# Patient Record
Sex: Male | Born: 1935
Health system: Southern US, Community
[De-identification: ages and names within clinical notes are randomized; demographics above are authoritative.]

## PROBLEM LIST (undated history)

## (undated) DIAGNOSIS — K635 Polyp of colon: Secondary | ICD-10-CM

## (undated) DIAGNOSIS — I10 Essential (primary) hypertension: Secondary | ICD-10-CM

## (undated) DIAGNOSIS — R011 Cardiac murmur, unspecified: Secondary | ICD-10-CM

## (undated) HISTORY — DX: Cardiac murmur, unspecified: R01.1

## (undated) HISTORY — DX: Polyp of colon: K63.5

## (undated) HISTORY — DX: Essential (primary) hypertension: I10

## (undated) HISTORY — PX: SPINE SURGERY: SHX786

## (undated) HISTORY — PX: BACK SURGERY: SHX140

---

## 1999-10-18 ENCOUNTER — Inpatient Hospital Stay (HOSPITAL_COMMUNITY): Admission: EM | Admit: 1999-10-18 | Discharge: 1999-10-19 | Payer: Self-pay | Admitting: Emergency Medicine

## 1999-10-18 ENCOUNTER — Encounter: Payer: Self-pay | Admitting: Emergency Medicine

## 1999-10-19 ENCOUNTER — Encounter: Payer: Self-pay | Admitting: *Deleted

## 2000-11-17 ENCOUNTER — Ambulatory Visit (HOSPITAL_COMMUNITY): Admission: RE | Admit: 2000-11-17 | Discharge: 2000-11-17 | Payer: Self-pay | Admitting: *Deleted

## 2010-08-23 LAB — HM COLONOSCOPY

## 2011-11-21 ENCOUNTER — Ambulatory Visit: Payer: Medicare Other

## 2011-11-21 ENCOUNTER — Ambulatory Visit (INDEPENDENT_AMBULATORY_CARE_PROVIDER_SITE_OTHER): Payer: Medicare Other | Admitting: Emergency Medicine

## 2011-11-21 VITALS — BP 183/78 | HR 66 | Resp 18 | Ht 75.0 in | Wt 231.0 lb

## 2011-11-21 DIAGNOSIS — M79671 Pain in right foot: Secondary | ICD-10-CM

## 2011-11-21 DIAGNOSIS — I10 Essential (primary) hypertension: Secondary | ICD-10-CM

## 2011-11-21 DIAGNOSIS — R739 Hyperglycemia, unspecified: Secondary | ICD-10-CM

## 2011-11-21 DIAGNOSIS — E1049 Type 1 diabetes mellitus with other diabetic neurological complication: Secondary | ICD-10-CM

## 2011-11-21 DIAGNOSIS — M25569 Pain in unspecified knee: Secondary | ICD-10-CM

## 2011-11-21 DIAGNOSIS — G629 Polyneuropathy, unspecified: Secondary | ICD-10-CM

## 2011-11-21 DIAGNOSIS — M79609 Pain in unspecified limb: Secondary | ICD-10-CM

## 2011-11-21 LAB — POCT CBC
Granulocyte percent: 62.5 %G (ref 37–80)
HCT, POC: 43.6 % (ref 43.5–53.7)
Hemoglobin: 14.2 g/dL (ref 14.1–18.1)
Lymph, poc: 1.7 (ref 0.6–3.4)
MCH, POC: 33 pg — AB (ref 27–31.2)
MCHC: 32.6 g/dL (ref 31.8–35.4)
MCV: 101.5 fL — AB (ref 80–97)
MID (cbc): 0.4 (ref 0–0.9)
MPV: 9.9 fL (ref 0–99.8)
POC Granulocyte: 3.5 (ref 2–6.9)
POC LYMPH PERCENT: 29.8 %L (ref 10–50)
POC MID %: 7.7 %M (ref 0–12)
Platelet Count, POC: 161 10*3/uL (ref 142–424)
RBC: 4.3 M/uL — AB (ref 4.69–6.13)
RDW, POC: 13.6 %
WBC: 5.6 10*3/uL (ref 4.6–10.2)

## 2011-11-21 LAB — RPR

## 2011-11-21 LAB — COMPREHENSIVE METABOLIC PANEL
Albumin: 4.3 g/dL (ref 3.5–5.2)
Alkaline Phosphatase: 78 U/L (ref 39–117)
BUN: 17 mg/dL (ref 6–23)
Calcium: 9.7 mg/dL (ref 8.4–10.5)
Creat: 1.12 mg/dL (ref 0.50–1.35)
Glucose, Bld: 119 mg/dL — ABNORMAL HIGH (ref 70–99)
Potassium: 4.8 mEq/L (ref 3.5–5.3)

## 2011-11-21 LAB — VITAMIN B12: Vitamin B-12: 260 pg/mL (ref 211–911)

## 2011-11-21 LAB — POCT GLYCOSYLATED HEMOGLOBIN (HGB A1C): Hemoglobin A1C: 5.2

## 2011-11-21 LAB — GLUCOSE, POCT (MANUAL RESULT ENTRY): POC Glucose: 120

## 2011-11-21 MED ORDER — GABAPENTIN 300 MG PO CAPS: ORAL_CAPSULE | ORAL | Status: DC

## 2011-11-21 NOTE — Patient Instructions (Signed)
Neuropathy Neuropathy means your peripheral nerves are not working normally. Peripheral nerves are the nerves outside the brain and spinal cord. Messages between the brain and the rest of the body do not work properly with peripheral nerve disorders. CAUSES There are many different causes of peripheral nerve disorders. These include:  Injury.   Infections.   Diabetes.   Vitamin deficiency.   Poor circulation.   Alcoholism.   Exposure to toxins.   Drug effects.   Tumors.   Kidney disease.  SYMPTOMS  Tingling, burning, pain, and numbness in the extremities.   Weakness and loss of muscle tone and size.  DIAGNOSIS Blood tests and special studies of nerve function may help confirm the diagnosis.  TREATMENT  Treatment includes adopting healthy life habits.   A good diet, vitamin supplements, and mild pain medicine may be needed.   Avoid known toxins such as alcohol, tobacco, and recreational drugs.   Anti-convulsant medicines are helpful in some types of neuropathy.  Make a follow-up appointment with your caregiver to be sure you are getting better with treatment.  SEEK IMMEDIATE MEDICAL CARE IF:   You have breathing problems.   You have severe or uncontrolled pain.   You notice extreme weakness or you feel faint.   You are not better after 1 week or if you have worse symptoms.  Document Released: 09/16/2004 Document Revised: 04/21/2011 Document Reviewed: 08/09/2005 [employer] Patient Information 2012 [employer]. 

## 2011-11-21 NOTE — Progress Notes (Signed)
  Subjective:    Patient ID: Collin Rose, male    DOB: [birth year], 77 y.o.   MRN: [id number]  HPI patient presents with a three-week history of a burning sensation in both feet. He says he has not had this before. He is a history of polio involving the left leg. He is status post surgery on the right knee years ago and has bony deformity of the knee related to this. He does not describe claudication symptoms he says the pain is intermittent during the day. It is more burning numb sensation than a true pain.    Review of Systems is no been diagnosed with a neuropathy. In reviewing the chart he has had an elevated MCV in the past with a normal hemoglobin raising the question of B12 deficiency.     Objective:   Physical Exam examination of the right knee reveals a bowing deformity involving the left tibial plateau. There is decreased range of motion of that knee. There are palpable dorsalis pedis pulses in both feet. There are trace palpable posterior tibial pulses. There are no skin changes noted. There is a second filling in the great toes bilaterally. He has decreased vibratory sensation decreased fine touch in both feet. This extends up to mid tibia bilaterally. There are no reflexes present ankles are  [facility] reading (PRIMARY) by  Dr.Raphael Fitzpatrick x-ray shows severe loss of joint space with calcification with a bar across the tibia        Assessment & Plan:  He has a deformity of the right knee where he has had surgery before he would like checked. His symptoms and physical exam are consistent with a neuropathy. Possibilities are B12 folic acid visit deficiency versus diabetes versus one of the other causes of neuropathy

## 2011-11-29 NOTE — Progress Notes (Signed)
Addended by: Morrell Riddle on: 11/29/2011 10:18 AM   Modules accepted: Orders

## 2011-12-01 ENCOUNTER — Other Ambulatory Visit: Payer: Self-pay | Admitting: Physician Assistant

## 2011-12-01 ENCOUNTER — Ambulatory Visit
Admission: RE | Admit: 2011-12-01 | Discharge: 2011-12-01 | Disposition: A | Discharge status: Home / Self Care | Payer: Medicare Other | Source: Ambulatory Visit | Attending: Physician Assistant | Admitting: Physician Assistant

## 2011-12-01 DIAGNOSIS — M79671 Pain in right foot: Secondary | ICD-10-CM

## 2011-12-01 DIAGNOSIS — G629 Polyneuropathy, unspecified: Secondary | ICD-10-CM

## 2011-12-01 DIAGNOSIS — M79672 Pain in left foot: Secondary | ICD-10-CM

## 2011-12-13 ENCOUNTER — Ambulatory Visit (INDEPENDENT_AMBULATORY_CARE_PROVIDER_SITE_OTHER): Payer: Medicare Other | Admitting: Emergency Medicine

## 2011-12-13 VITALS — BP 160/74 | HR 60 | Temp 98.0°F | Resp 16 | Ht 75.0 in | Wt 257.2 lb

## 2011-12-13 DIAGNOSIS — G589 Mononeuropathy, unspecified: Secondary | ICD-10-CM

## 2011-12-13 DIAGNOSIS — H9190 Unspecified hearing loss, unspecified ear: Secondary | ICD-10-CM

## 2011-12-13 DIAGNOSIS — H6120 Impacted cerumen, unspecified ear: Secondary | ICD-10-CM

## 2011-12-13 DIAGNOSIS — K219 Gastro-esophageal reflux disease without esophagitis: Secondary | ICD-10-CM

## 2011-12-13 DIAGNOSIS — I10 Essential (primary) hypertension: Secondary | ICD-10-CM

## 2011-12-13 DIAGNOSIS — G629 Polyneuropathy, unspecified: Secondary | ICD-10-CM

## 2011-12-13 MED ORDER — GABAPENTIN 300 MG PO CAPS: ORAL_CAPSULE | ORAL | Status: DC

## 2011-12-13 NOTE — Progress Notes (Signed)
  Subjective:    Patient ID: Collin Rose, male    DOB: May 09, 1935, 77 y.o.   MRN: [id number]  HPI patient here with difficulty hearing out of both ears. He feels he has wax impaction. He has had no pain he does not have a sore throat cold or congestion symptoms to    Review of Systems he was seen 3 weeks ago and started on Neurontin 300 mg twice a day. His neuropathy symptoms are markedly improved since starting the Neurontin.     Objective:   Physical Exam both TMs are occluded with wax.  Arterial Doppler studies which were done last visit showed calcification along the vessels but normal flow to      Assessment & Plan:  I wrote for refills on his Neurontin. I also will irrigate both ears.

## 2012-02-02 ENCOUNTER — Ambulatory Visit (INDEPENDENT_AMBULATORY_CARE_PROVIDER_SITE_OTHER): Payer: Medicare Other | Admitting: Emergency Medicine

## 2012-02-02 VITALS — BP 170/72 | HR 64 | Temp 97.8°F | Resp 16 | Ht 75.38 in | Wt 251.2 lb

## 2012-02-02 DIAGNOSIS — G589 Mononeuropathy, unspecified: Secondary | ICD-10-CM

## 2012-02-02 DIAGNOSIS — E785 Hyperlipidemia, unspecified: Secondary | ICD-10-CM

## 2012-02-02 DIAGNOSIS — I10 Essential (primary) hypertension: Secondary | ICD-10-CM

## 2012-02-02 DIAGNOSIS — R7309 Other abnormal glucose: Secondary | ICD-10-CM

## 2012-02-02 DIAGNOSIS — R739 Hyperglycemia, unspecified: Secondary | ICD-10-CM

## 2012-02-02 DIAGNOSIS — G629 Polyneuropathy, unspecified: Secondary | ICD-10-CM

## 2012-02-02 LAB — POCT CBC
HCT, POC: 45.5 % (ref 43.5–53.7)
Hemoglobin: 14.9 g/dL (ref 14.1–18.1)
Lymph, poc: 1.7 (ref 0.6–3.4)
MCH, POC: 33.1 pg — AB (ref 27–31.2)
MCHC: 32.7 g/dL (ref 31.8–35.4)
POC Granulocyte: 2.8 (ref 2–6.9)
POC LYMPH PERCENT: 34.6 %L (ref 10–50)
POC MID %: 9.3 %M (ref 0–12)
RDW, POC: 13.6 %
WBC: 5 10*3/uL (ref 4.6–10.2)

## 2012-02-02 LAB — COMPREHENSIVE METABOLIC PANEL
ALT: 15 U/L (ref 0–53)
AST: 19 U/L (ref 0–37)
Alkaline Phosphatase: 74 U/L (ref 39–117)
BUN: 13 mg/dL (ref 6–23)
Calcium: 8.8 mg/dL (ref 8.4–10.5)
Creat: 1.12 mg/dL (ref 0.50–1.35)
Total Bilirubin: 0.8 mg/dL (ref 0.3–1.2)

## 2012-02-02 LAB — LIPID PANEL
HDL: 52 mg/dL (ref >39)
LDL Cholesterol: 122 mg/dL — ABNORMAL HIGH (ref 0–99)
Total CHOL/HDL Ratio: 3.6 Ratio
Triglycerides: 62 mg/dL (ref <150)
VLDL: 12 mg/dL (ref 0–40)

## 2012-02-02 LAB — GLUCOSE, POCT (MANUAL RESULT ENTRY): POC Glucose: 115 mg/dl — AB (ref 70–99)

## 2012-02-02 LAB — TSH: TSH: 1.196 u[IU]/mL (ref 0.350–4.500)

## 2012-02-02 MED ORDER — METOPROLOL TARTRATE 100 MG PO TABS: 100.0000 mg | ORAL_TABLET | Freq: Every morning | ORAL | Status: DC

## 2012-02-02 MED ORDER — DOXAZOSIN MESYLATE 8 MG PO TABS: 8.0000 mg | ORAL_TABLET | Freq: Every day | ORAL | Status: DC

## 2012-02-02 MED ORDER — HYDROCHLOROTHIAZIDE 12.5 MG PO TABS: 12.5000 mg | ORAL_TABLET | Freq: Every day | ORAL | Status: DC

## 2012-02-02 MED ORDER — AMLODIPINE BESYLATE 5 MG PO TABS: 5.0000 mg | ORAL_TABLET | Freq: Every day | ORAL | Status: DC

## 2012-02-02 NOTE — Patient Instructions (Addendum)
Hypertension As your heart beats, it forces blood through your arteries. This force is your blood pressure. If the pressure is too high, it is called hypertension (HTN) or high blood pressure. HTN is dangerous because you may have it and not know it. High blood pressure may mean that your heart has to work harder to pump blood. Your arteries may be narrow or stiff. The extra work puts you at risk for heart disease, stroke, and other problems.  Blood pressure consists of two numbers, a higher number over a lower, 110/72, for example. It is stated as "110 over 72." The ideal is below 120 for the top number (systolic) and under 80 for the bottom (diastolic). Write down your blood pressure today. You should pay close attention to your blood pressure if you have certain conditions such as:  Heart failure.   Prior heart attack.   Diabetes   Chronic kidney disease.   Prior stroke.   Multiple risk factors for heart disease.  To see if you have HTN, your blood pressure should be measured while you are seated with your arm held at the level of the heart. It should be measured at least twice. A one-time elevated blood pressure reading (especially in the Emergency Department) does not mean that you need treatment. There may be conditions in which the blood pressure is different between your right and left arms. It is important to see your caregiver soon for a recheck. Most people have essential hypertension which means that there is not a specific cause. This type of high blood pressure may be lowered by changing lifestyle factors such as:  Stress.   Smoking.   Lack of exercise.   Excessive weight.   Drug/tobacco/alcohol use.   Eating less salt.  Most people do not have symptoms from high blood pressure until it has caused damage to the body. Effective treatment can often prevent, delay or reduce that damage. TREATMENT  When a cause has been identified, treatment for high blood pressure is  directed at the cause. There are a large number of medications to treat HTN. These fall into several categories, and your caregiver will help you select the medicines that are best for you. Medications may have side effects. You should review side effects with your caregiver. If your blood pressure stays high after you have made lifestyle changes or started on medicines,   Your medication(s) may need to be changed.   Other problems may need to be addressed.   Be certain you understand your prescriptions, and know how and when to take your medicine.   Be sure to follow up with your caregiver within the time frame advised (usually within two weeks) to have your blood pressure rechecked and to review your medications.   If you are taking more than one medicine to lower your blood pressure, make sure you know how and at what times they should be taken. Taking two medicines at the same time can result in blood pressure that is too low.  SEEK IMMEDIATE MEDICAL CARE IF:  You develop a severe headache, blurred or changing vision, or confusion.   You have unusual weakness or numbness, or a faint feeling.   You have severe chest or abdominal pain, vomiting, or breathing problems.  MAKE SURE YOU:   Understand these instructions.   Will watch your condition.   Will get help right away if you are not doing well or get worse.  Document Released: 08/09/2005 Document Revised: 07/29/2011 Document Reviewed:   03/29/2008 ExitCare Patient Information 2012 [place], [place].Neuropathy Neuropathy means your peripheral nerves are not working normally. Peripheral nerves are the nerves outside the brain and spinal cord. Messages between the brain and the rest of the body do not work properly with peripheral nerve disorders. CAUSES There are many different causes of peripheral nerve disorders. These include:  Injury.   Infections.   Diabetes.   Vitamin deficiency.   Poor circulation.   Alcoholism.    Exposure to toxins.   Drug effects.   Tumors.   Kidney disease.  SYMPTOMS  Tingling, burning, pain, and numbness in the extremities.   Weakness and loss of muscle tone and size.  DIAGNOSIS Blood tests and special studies of nerve function may help confirm the diagnosis.  TREATMENT  Treatment includes adopting healthy life habits.   A good diet, vitamin supplements, and mild pain medicine may be needed.   Avoid known toxins such as alcohol, tobacco, and recreational drugs.   Anti-convulsant medicines are helpful in some types of neuropathy.  Make a follow-up appointment with your caregiver to be sure you are getting better with treatment.  SEEK IMMEDIATE MEDICAL CARE IF:   You have breathing problems.   You have severe or uncontrolled pain.   You notice extreme weakness or you feel faint.   You are not better after 1 week or if you have worse symptoms.  Document Released: 09/16/2004 Document Revised: 04/21/2011 Document Reviewed: 08/09/2005 [facility] Patient Information 2012 [place], [place].

## 2012-02-02 NOTE — Progress Notes (Signed)
  Subjective:    Patient ID: Collin Rose, male    DOB: December 11, 1934, 77 y.o.   MRN: [id number]  HPI patient had a followup high blood pressure slightly elevated cholesterol and hyperglycemia. He is about to run out of his blood pressure medication needs his refill.    Review of Systems he denies chest pain shortness of breath or any other medical complaints at this time. He has neuropathy involving lower extremities is currently on Neurontin for this and getting relief.     Objective:   Physical Exam  Constitutional: He appears well-developed and well-nourished.  Eyes: Pupils are equal, round, and reactive to light.  Neck: No tracheal deviation present. No thyromegaly present.  Cardiovascular: Normal rate and regular rhythm.   Pulmonary/Chest: No respiratory distress. He has no wheezes. He has no rales.  Abdominal: He exhibits no distension. There is no tenderness. There is no rebound.  Neurological:       Decreased sensation lower extremity   Results for orders placed in visit on 02/02/12  POCT CBC      Component Value Range   WBC 5.0  4.6 - 10.2 K/uL   Lymph, poc 1.7  0.6 - 3.4   POC LYMPH PERCENT 34.6  10 - 50 %L   MID (cbc) 0.5  0 - 0.9   POC MID % 9.3  0 - 12 %M   POC Granulocyte 2.8  2 - 6.9   Granulocyte percent 56.1  37 - 80 %G   RBC 4.50 (*) 4.69 - 6.13 M/uL   Hemoglobin 14.9  14.1 - 18.1 g/dL   HCT, POC 40.9  81.1 - 53.7 %   MCV 101.1 (*) 80 - 97 fL   MCH, POC 33.1 (*) 27 - 31.2 pg   MCHC 32.7  31.8 - 35.4 g/dL   RDW, POC 91.4     Platelet Count, POC 147  142 - 424 K/uL   MPV 11.1  0 - 99.8 fL  GLUCOSE, POCT (MANUAL RESULT ENTRY)      Component Value Range   POC Glucose 115 (*) 70 - 99 mg/dl  POCT GLYCOSYLATED HEMOGLOBIN (HGB A1C)      Component Value Range   Hemoglobin A1C 5.4           Assessment & Plan:  Patient stable on current medication regimen. All medications refilled for one year we'll recheck in approximately 4 months.

## 2012-05-26 ENCOUNTER — Ambulatory Visit (INDEPENDENT_AMBULATORY_CARE_PROVIDER_SITE_OTHER): Payer: Medicare Other | Admitting: Family Medicine

## 2012-05-26 DIAGNOSIS — Z23 Encounter for immunization: Secondary | ICD-10-CM

## 2013-01-16 ENCOUNTER — Ambulatory Visit: Payer: Medicare Other

## 2013-01-16 ENCOUNTER — Encounter: Payer: Self-pay | Admitting: Emergency Medicine

## 2013-01-16 ENCOUNTER — Ambulatory Visit (INDEPENDENT_AMBULATORY_CARE_PROVIDER_SITE_OTHER): Payer: Medicare Other | Admitting: Emergency Medicine

## 2013-01-16 VITALS — BP 140/62 | HR 60 | Temp 98.0°F | Resp 16 | Ht 75.5 in | Wt 247.6 lb

## 2013-01-16 DIAGNOSIS — M549 Dorsalgia, unspecified: Secondary | ICD-10-CM

## 2013-01-16 DIAGNOSIS — G629 Polyneuropathy, unspecified: Secondary | ICD-10-CM

## 2013-01-16 DIAGNOSIS — I10 Essential (primary) hypertension: Secondary | ICD-10-CM

## 2013-01-16 DIAGNOSIS — Z0000 Encounter for general adult medical examination without abnormal findings: Secondary | ICD-10-CM

## 2013-01-16 DIAGNOSIS — R011 Cardiac murmur, unspecified: Secondary | ICD-10-CM

## 2013-01-16 DIAGNOSIS — G589 Mononeuropathy, unspecified: Secondary | ICD-10-CM

## 2013-01-16 LAB — POCT URINALYSIS DIPSTICK
Glucose, UA: NEGATIVE
Ketones, UA: NEGATIVE
Spec Grav, UA: >=1.03

## 2013-01-16 LAB — IFOBT (OCCULT BLOOD): IFOBT: NEGATIVE

## 2013-01-16 LAB — CBC WITH DIFFERENTIAL/PLATELET
Eosinophils Absolute: 0.3 10*3/uL (ref 0.0–0.7)
HCT: 41.1 % (ref 39.0–52.0)
Hemoglobin: 14.4 g/dL (ref 13.0–17.0)
Lymphs Abs: 1.7 10*3/uL (ref 0.7–4.0)
MCH: 33.6 pg (ref 26.0–34.0)
MCHC: 35 g/dL (ref 30.0–36.0)
MCV: 96 fL (ref 78.0–100.0)
Monocytes Absolute: 0.5 10*3/uL (ref 0.1–1.0)
Monocytes Relative: 10 % (ref 3–12)
Neutrophils Relative %: 51 % (ref 43–77)
RBC: 4.28 MIL/uL (ref 4.22–5.81)

## 2013-01-16 MED ORDER — GABAPENTIN 300 MG PO CAPS: ORAL_CAPSULE | ORAL | Status: DC

## 2013-01-16 MED ORDER — AMLODIPINE BESYLATE 5 MG PO TABS: 5.0000 mg | ORAL_TABLET | Freq: Every day | ORAL | Status: DC

## 2013-01-16 MED ORDER — HYDROCHLOROTHIAZIDE 12.5 MG PO TABS: 12.5000 mg | ORAL_TABLET | Freq: Every day | ORAL | Status: DC

## 2013-01-16 MED ORDER — METOPROLOL TARTRATE 100 MG PO TABS: 100.0000 mg | ORAL_TABLET | Freq: Every morning | ORAL | Status: DC

## 2013-01-16 MED ORDER — DOXAZOSIN MESYLATE 8 MG PO TABS: 8.0000 mg | ORAL_TABLET | Freq: Every day | ORAL | Status: DC

## 2013-01-16 NOTE — Progress Notes (Signed)
  Subjective:    Patient ID: Collin Rose, male    DOB: Mar 04, 1935, 78 y.o.   MRN: [id number]  HPI    Review of Systems  Constitutional: Negative.   HENT: Positive for sinus pressure.   Eyes: Positive for itching.  Respiratory: Negative.   Cardiovascular: Negative.   Gastrointestinal: Negative.   Endocrine: Negative.   Genitourinary: Negative.   Musculoskeletal: Positive for myalgias, back pain, arthralgias and gait problem.  Skin: Negative.   Allergic/Immunologic: Negative.   Neurological: Negative.   Hematological: Bruises/bleeds easily.  Psychiatric/Behavioral: Negative.        Objective:   Physical Exam H. EENT exam is unremarkable. He does have some scaly actinic keratosis on the top of his head. His neck is supple there are no bruits heard cardiac exam reveals a regular rate and rhythm. There is a 2/6 systolic murmur at the base of the heart with some radiation into the neck. Lung exam shows clearness to auscultation and percussion. Abdomen is soft liver spleen not enlarged there are no areas of tenderness. Extremity exam reveals deformity of the right knee. Pulses are minimal and dorsalis pedis and posterior tibial. He has evidence of neuropathy of the lower extremities.  [facility] reading (PRIMARY) by  Dr. Cleta Alberts ossification seen on lateral at L3-L4 level please comment. patient has degenerative disc disease L5-S1        Assessment & Plan:  We'll go ahead with cardiology referral. We'll go ahead and get a film of his back. He's had previous surgery and his symptoms are consistent with degenerative arthritis of the back. No change in his medications. Films showed degenerative disc disease I suspect this is the source of his pain. There is a calcific area also seen and may be in the lymph node are possibly vascular.

## 2013-01-17 LAB — LIPID PANEL
Cholesterol: 172 mg/dL (ref 0–200)
HDL: 59 mg/dL (ref >39)
Triglycerides: 89 mg/dL (ref <150)
VLDL: 18 mg/dL (ref 0–40)

## 2013-01-17 LAB — COMPREHENSIVE METABOLIC PANEL
Albumin: 3.9 g/dL (ref 3.5–5.2)
BUN: 12 mg/dL (ref 6–23)
CO2: 27 mEq/L (ref 19–32)
Glucose, Bld: 99 mg/dL (ref 70–99)
Sodium: 139 mEq/L (ref 135–145)
Total Bilirubin: 0.7 mg/dL (ref 0.3–1.2)
Total Protein: 6.8 g/dL (ref 6.0–8.3)

## 2013-01-23 ENCOUNTER — Other Ambulatory Visit: Payer: Self-pay | Admitting: Radiology

## 2013-01-24 ENCOUNTER — Encounter: Payer: Self-pay | Admitting: Emergency Medicine

## 2013-05-03 ENCOUNTER — Encounter: Payer: Self-pay | Admitting: Emergency Medicine

## 2013-05-23 ENCOUNTER — Ambulatory Visit (INDEPENDENT_AMBULATORY_CARE_PROVIDER_SITE_OTHER): Payer: Medicare Other | Admitting: *Deleted

## 2013-05-23 DIAGNOSIS — Z23 Encounter for immunization: Secondary | ICD-10-CM

## 2014-01-04 ENCOUNTER — Telehealth: Payer: Self-pay

## 2014-01-04 DIAGNOSIS — I10 Essential (primary) hypertension: Secondary | ICD-10-CM

## 2014-01-04 DIAGNOSIS — G629 Polyneuropathy, unspecified: Secondary | ICD-10-CM

## 2014-01-04 MED ORDER — AMLODIPINE BESYLATE 5 MG PO TABS: 5.0000 mg | ORAL_TABLET | Freq: Every day | ORAL | Status: DC

## 2014-01-04 MED ORDER — METOPROLOL TARTRATE 100 MG PO TABS: 100.0000 mg | ORAL_TABLET | Freq: Every morning | ORAL | Status: DC

## 2014-01-04 MED ORDER — DOXAZOSIN MESYLATE 8 MG PO TABS: 8.0000 mg | ORAL_TABLET | Freq: Every day | ORAL | Status: DC

## 2014-01-04 MED ORDER — HYDROCHLOROTHIAZIDE 12.5 MG PO TABS: 12.5000 mg | ORAL_TABLET | Freq: Every day | ORAL | Status: DC

## 2014-01-04 NOTE — Telephone Encounter (Signed)
DAUB - Patient scheduled his CPE with you for June 25, but he will be out of his BP medicine by June 22.  Can we please get this refilled at least a week before June 22?  He uses mail order and just wants to ensure that he doesn't run out.  [phone]

## 2014-01-04 NOTE — Telephone Encounter (Signed)
Sent in prescriptions without refills to the mail order. He is not able to have 30 day supply due to insurance. They will only cover 90 day supply. Pt has appt June 25

## 2014-01-08 MED ORDER — GABAPENTIN 300 MG PO CAPS: ORAL_CAPSULE | ORAL | Status: DC

## 2014-01-08 NOTE — Telephone Encounter (Signed)
Sending in the 1 RF of gabapentin also. Pt has appt 02/14/14 and is not able to get 30 day supply from mail order.

## 2014-01-08 NOTE — Addendum Note (Signed)
Addended by: Sheppard PlumberBRIGGS, Navy Rothschild A on: 01/08/2014 04:22 PM   Modules accepted: Orders

## 2014-02-14 ENCOUNTER — Other Ambulatory Visit: Payer: Self-pay

## 2014-02-14 ENCOUNTER — Ambulatory Visit (INDEPENDENT_AMBULATORY_CARE_PROVIDER_SITE_OTHER): Payer: Medicare Other | Admitting: Emergency Medicine

## 2014-02-14 ENCOUNTER — Encounter: Payer: Self-pay | Admitting: Emergency Medicine

## 2014-02-14 VITALS — BP 123/72 | HR 55 | Temp 98.0°F | Resp 16 | Ht 76.0 in | Wt 260.0 lb

## 2014-02-14 DIAGNOSIS — M545 Low back pain, unspecified: Secondary | ICD-10-CM

## 2014-02-14 DIAGNOSIS — Z139 Encounter for screening, unspecified: Secondary | ICD-10-CM

## 2014-02-14 DIAGNOSIS — B91 Sequelae of poliomyelitis: Secondary | ICD-10-CM

## 2014-02-14 DIAGNOSIS — Z0000 Encounter for general adult medical examination without abnormal findings: Secondary | ICD-10-CM

## 2014-02-14 DIAGNOSIS — I10 Essential (primary) hypertension: Secondary | ICD-10-CM

## 2014-02-14 DIAGNOSIS — A809 Acute poliomyelitis, unspecified: Secondary | ICD-10-CM

## 2014-02-14 DIAGNOSIS — M6281 Muscle weakness (generalized): Secondary | ICD-10-CM

## 2014-02-14 DIAGNOSIS — Z23 Encounter for immunization: Secondary | ICD-10-CM

## 2014-02-14 LAB — CBC WITH DIFFERENTIAL/PLATELET
BASOS ABS: 0 10*3/uL (ref 0.0–0.1)
BASOS PCT: 0 % (ref 0–1)
EOS PCT: 3 % (ref 0–5)
Eosinophils Absolute: 0.2 10*3/uL (ref 0.0–0.7)
HEMATOCRIT: 42.3 % (ref 39.0–52.0)
HEMOGLOBIN: 14.8 g/dL (ref 13.0–17.0)
Lymphocytes Relative: 24 % (ref 12–46)
Lymphs Abs: 1.2 10*3/uL (ref 0.7–4.0)
MCH: 34.7 pg — ABNORMAL HIGH (ref 26.0–34.0)
MCHC: 35 g/dL (ref 30.0–36.0)
MCV: 99.1 fL (ref 78.0–100.0)
MONO ABS: 0.5 10*3/uL (ref 0.1–1.0)
MONOS PCT: 9 % (ref 3–12)
NEUTROS ABS: 3.3 10*3/uL (ref 1.7–7.7)
Neutrophils Relative %: 64 % (ref 43–77)
Platelets: 159 10*3/uL (ref 150–400)
RBC: 4.27 MIL/uL (ref 4.22–5.81)
RDW: 13.5 % (ref 11.5–15.5)
WBC: 5.2 10*3/uL (ref 4.0–10.5)

## 2014-02-14 LAB — POCT URINALYSIS DIPSTICK
Glucose, UA: NEGATIVE
KETONES UA: NEGATIVE
Leukocytes, UA: NEGATIVE
Nitrite, UA: NEGATIVE
PH UA: 5
PROTEIN UA: NEGATIVE
Urobilinogen, UA: 0.2

## 2014-02-14 LAB — COMPLETE METABOLIC PANEL WITH GFR
ALBUMIN: 4.1 g/dL (ref 3.5–5.2)
ALK PHOS: 79 U/L (ref 39–117)
ALT: 18 U/L (ref 0–53)
AST: 21 U/L (ref 0–37)
BUN: 15 mg/dL (ref 6–23)
CALCIUM: 8.9 mg/dL (ref 8.4–10.5)
CHLORIDE: 102 meq/L (ref 96–112)
CO2: 28 mEq/L (ref 19–32)
CREATININE: 1.06 mg/dL (ref 0.50–1.35)
GFR, EST NON AFRICAN AMERICAN: 68 mL/min
GFR, Est African American: 78 mL/min
GLUCOSE: 111 mg/dL — AB (ref 70–99)
POTASSIUM: 4.7 meq/L (ref 3.5–5.3)
Sodium: 138 mEq/L (ref 135–145)
Total Bilirubin: 1 mg/dL (ref 0.2–1.2)
Total Protein: 7 g/dL (ref 6.0–8.3)

## 2014-02-14 LAB — LIPID PANEL
CHOL/HDL RATIO: 2.9 ratio
CHOLESTEROL: 194 mg/dL (ref 0–200)
HDL: 68 mg/dL (ref >39)
LDL Cholesterol: 115 mg/dL — ABNORMAL HIGH (ref 0–99)
Triglycerides: 57 mg/dL (ref <150)
VLDL: 11 mg/dL (ref 0–40)

## 2014-02-14 LAB — TSH: TSH: 1.004 u[IU]/mL (ref 0.350–4.500)

## 2014-02-14 LAB — IFOBT (OCCULT BLOOD): IFOBT: NEGATIVE

## 2014-02-14 MED ORDER — HYDROCHLOROTHIAZIDE 12.5 MG PO TABS: 12.5000 mg | ORAL_TABLET | Freq: Every day | ORAL | Status: DC

## 2014-02-14 MED ORDER — METOPROLOL TARTRATE 100 MG PO TABS: 100.0000 mg | ORAL_TABLET | Freq: Every morning | ORAL | Status: DC

## 2014-02-14 MED ORDER — AMLODIPINE BESYLATE 5 MG PO TABS: 1.0000 mg | ORAL_TABLET | Freq: Every day | ORAL | Status: DC

## 2014-02-14 MED ORDER — DOXAZOSIN MESYLATE 8 MG PO TABS: 8.0000 mg | ORAL_TABLET | Freq: Every day | ORAL | Status: DC

## 2014-02-14 NOTE — Progress Notes (Signed)
Subjective:    Patient ID: Collin Rose, male    DOB: 1935/03/31, 79 y.o.   MRN: [id number]  HPI Scribed for Earl LitesSteve Maylynn Orzechowski MD, the patient was seen in room 30. This chart was scribed by Lewanda RifeAlexandra Hurtado, ED scribe. Patient's care was started at 10:30 AM  HPI Comments: Hx was provided by the pt.  Collin Rose is a 79 y.o. male who presents to the [facility] for his annual physical exam. Pt presents with a PMHx of low back pain complaining of constant low back pain onset for 1 week. Describes pain as soreness. States pain is exacerbated by all positions. Denies trying any alleviating factors. Denies associated recent injuries, fall , and leg weakness. Denies urinary or fecal incontinence, urinary retention, perineal/saddle paresthesias, and fever. Denies other complaints at this time.     Past Medical History  Diagnosis Date  . Hypertension   . Heart murmur     Past Surgical History  Procedure Laterality Date  . Spine surgery      Family History  Problem Relation Age of Onset  . Cancer Mother   . Stroke Father     History   Social History  . Marital Status: Married    Spouse Name: [name]    Number of Children: [name]  . Years of Education: [name]   Occupational History  . Not on file.   Social History Main Topics  . Smoking status: Former Smoker -- 1.00 packs/day    Types: Cigarettes  . Smokeless tobacco: Not on file  . Alcohol Use: Yes  . Drug Use: Not on file  . Sexual Activity: Not on file   Other Topics Concern  . Not on file   Social History Narrative  . No narrative on file    No Known Allergies  Patient Active Problem List   Diagnosis Date Noted  . Hypertension 12/13/2011  . GERD (gastroesophageal reflux disease) 12/13/2011  . Neuropathy 12/13/2011    Filed Vitals:   02/14/14 0940  BP: 123/72  Pulse: 55  Temp: 98 F (36.7 C)  TempSrc: Oral  Resp: 16  Height: 6\' 4"  (1.93 m)  Weight: 260 lb (117.935 kg)  SpO2: 95%         Review of Systems  Constitutional: Negative for fever.  Genitourinary: Negative.   Musculoskeletal: Positive for arthralgias and back pain.  Skin: Negative.   Neurological: Negative.        Objective:   Physical Exam Physical Exam  Vitals reviewed. Constitutional: He is oriented to person, place, and time. He appears well-developed and well-nourished. No distress.  HENT:  Head: Normocephalic and atraumatic.  Throat: Oropharynx is clear and moist. Mucous membranes normal. Uvula is midline. Poor dentition.  Eyes: EOM are normal.  Neck: Neck supple. No tracheal deviation present.  Cardiovascular: Normal rate.  2/6 systolic murmur left sternal border.  Pulmonary/Chest: Effort normal. No respiratory distress.  Musculoskeletal: Normal range of motion. No midline C-spine, T-spine, or L-spine tenderness with no step-offs or deformities noted. TTP peri lumbar muscles, healed surgical scar noted. Severe arthritic changes to right knee.  Abdomen: soft and non-tender. No masses. No hernias.  GU: No hernia, Testicles normal, prostate normal size and no masses.  Neurological: He is alert and oriented to person, place, and time.  Skin: Skin is warm and dry.  Psychiatric: He has a normal mood and affect. His behavior is normal.        Assessment & Plan:  ICD-9-CM   1. Essential hypertension 401.9 IFOBT POC (occult bld, rslt in office)    POCT urinalysis dipstick    CBC with Differential    COMPLETE METABOLIC PANEL WITH GFR    TSH    Lipid panel    PSA, Medicare    EKG 12-Lead  2. Routine general medical examination at a health care facility V70.0 IFOBT POC (occult bld, rslt in office)    POCT urinalysis dipstick    CBC with Differential    COMPLETE METABOLIC PANEL WITH GFR    TSH    Lipid panel    PSA, Medicare    EKG 12-Lead  3. Need for prophylactic vaccination against Streptococcus pneumoniae (pneumococcus) V03.82 Pneumococcal conjugate vaccine 13-valent IM     I personally performed the services described in this documentation, which was scribed in my presence. The recorded information has been reviewed and is accurate.

## 2014-02-14 NOTE — Progress Notes (Signed)
   Subjective:    Patient ID: Collin Rose, male    DOB: 10-09-1934, 79 y.o.   MRN: [id number]  HPI    Review of Systems  Constitutional: Negative.   HENT: Positive for sinus pressure.   Eyes: Positive for itching.  Respiratory: Negative.   Cardiovascular: Negative.   Gastrointestinal: Negative.   Endocrine: Negative.   Genitourinary: Negative.   Musculoskeletal: Positive for back pain.  Skin: Negative.   Allergic/Immunologic: Negative.   Neurological: Negative.   Hematological: Negative.   Psychiatric/Behavioral: Negative.        Objective:   Physical Exam        Assessment & Plan:

## 2014-02-14 NOTE — Telephone Encounter (Signed)
primemail needed MD superviser per [place] law since Heather had signed Rxs prev. I resent all 4 Rxs under Dr Ellis Parentsaub's name.

## 2014-02-15 LAB — PSA, MEDICARE: PSA: 0.95 ng/mL (ref <=4.00)

## 2014-02-18 ENCOUNTER — Other Ambulatory Visit: Payer: Self-pay | Admitting: *Deleted

## 2014-02-18 NOTE — Telephone Encounter (Signed)
Error

## 2014-05-08 ENCOUNTER — Telehealth: Payer: Self-pay

## 2014-05-08 NOTE — Telephone Encounter (Signed)
DAUB - Pt received his BP medicine in the mail yesterday.  He usually gets 90 pills with 3 refills.  Now it says its 45 with 2 refills and three others with 90 pills and only 1 refill.  The dose of his medicine has also been changed. Now he is completely confused as to what to take.  Please call asap [phone].

## 2014-05-08 NOTE — Telephone Encounter (Signed)
Lm for rtn call- this office has not sent any refills to the [facility]. Pt needs to call pharmacy to find out what happened. He should still have 1 refill left on his medications.

## 2014-05-09 NOTE — Telephone Encounter (Signed)
There is no mention in my note of a change. Please have him back on the amlodipine 5 mg #90

## 2014-05-09 NOTE — Telephone Encounter (Signed)
Spoke to Mr. Hur- he has always taken 1 tablet daily (  Amlodipine) on his last prescription refill in June amlodipine was sent for taking 1/2 tablet daily (2.5mg ). The pharmacy sent him 45 tablets for his 90 day supply. Did his dose change? I do not see mention of this in the OV. Please advise.

## 2014-05-10 ENCOUNTER — Other Ambulatory Visit: Payer: Self-pay

## 2014-05-10 MED ORDER — AMLODIPINE BESYLATE 5 MG PO TABS: 5.0000 mg | ORAL_TABLET | Freq: Every day | ORAL | Status: DC

## 2014-05-10 NOTE — Telephone Encounter (Signed)
He has enough medication for six months. Would you like his appt to be in six months to get refills or just go ahead and give him enough for a year supply?

## 2014-05-10 NOTE — Telephone Encounter (Signed)
Dr. Cleta Alberts,  I sent in a 90 day supply with 3 refills for  of Amlodipine.  Pt was also wondering why he was given a 6 month supply instead of a year supply on his Doxazosin, HCTZ, and Metoprolol. He said he received 90 pills with 1 refill on all three of the medications. I told him sometimes we will have them come back in six months to have their blood drawn to check liver and kidney function because of the medications, but he said you did not tell him that he needed to return in six months. Please advise.

## 2014-05-10 NOTE — Telephone Encounter (Signed)
We can refill meds. Have him make an appt to see me

## 2014-05-11 MED ORDER — METOPROLOL TARTRATE 100 MG PO TABS: 100.0000 mg | ORAL_TABLET | Freq: Every morning | ORAL | Status: DC

## 2014-05-11 MED ORDER — HYDROCHLOROTHIAZIDE 12.5 MG PO TABS: 12.5000 mg | ORAL_TABLET | Freq: Every day | ORAL | Status: DC

## 2014-05-11 MED ORDER — DOXAZOSIN MESYLATE 8 MG PO TABS: 8.0000 mg | ORAL_TABLET | Freq: Every day | ORAL | Status: DC

## 2014-05-11 NOTE — Telephone Encounter (Signed)
Additional RF's sent to pharm.

## 2014-05-11 NOTE — Telephone Encounter (Signed)
Just another 6 months. Thanks

## 2014-05-11 NOTE — Addendum Note (Signed)
Addended by: Johnnette Litter on: 05/11/2014 09:33 AM   Modules accepted: Orders

## 2014-05-23 ENCOUNTER — Ambulatory Visit (INDEPENDENT_AMBULATORY_CARE_PROVIDER_SITE_OTHER): Payer: Medicare Other | Admitting: Radiology

## 2014-05-23 DIAGNOSIS — Z23 Encounter for immunization: Secondary | ICD-10-CM

## 2014-08-08 ENCOUNTER — Ambulatory Visit: Payer: Medicare Other | Admitting: Emergency Medicine

## 2014-10-30 ENCOUNTER — Ambulatory Visit (INDEPENDENT_AMBULATORY_CARE_PROVIDER_SITE_OTHER): Payer: Medicare Other

## 2014-10-30 ENCOUNTER — Ambulatory Visit (INDEPENDENT_AMBULATORY_CARE_PROVIDER_SITE_OTHER): Payer: Medicare Other | Admitting: Emergency Medicine

## 2014-10-30 ENCOUNTER — Encounter (HOSPITAL_COMMUNITY): Payer: Medicare Other

## 2014-10-30 VITALS — BP 146/62 | HR 69 | Temp 98.0°F | Resp 18 | Wt 279.0 lb

## 2014-10-30 DIAGNOSIS — D649 Anemia, unspecified: Secondary | ICD-10-CM

## 2014-10-30 DIAGNOSIS — R609 Edema, unspecified: Secondary | ICD-10-CM | POA: Diagnosis not present

## 2014-10-30 DIAGNOSIS — I10 Essential (primary) hypertension: Secondary | ICD-10-CM | POA: Diagnosis not present

## 2014-10-30 LAB — COMPLETE METABOLIC PANEL WITH GFR
ALBUMIN: 3.7 g/dL (ref 3.5–5.2)
ALT: 16 U/L (ref 0–53)
AST: 19 U/L (ref 0–37)
Alkaline Phosphatase: 74 U/L (ref 39–117)
BUN: 13 mg/dL (ref 6–23)
CALCIUM: 8.7 mg/dL (ref 8.4–10.5)
CHLORIDE: 101 meq/L (ref 96–112)
CO2: 24 mEq/L (ref 19–32)
Creat: 0.85 mg/dL (ref 0.50–1.35)
GFR, EST NON AFRICAN AMERICAN: 84 mL/min
GLUCOSE: 98 mg/dL (ref 70–99)
POTASSIUM: 4.1 meq/L (ref 3.5–5.3)
Sodium: 137 mEq/L (ref 135–145)
TOTAL PROTEIN: 6.5 g/dL (ref 6.0–8.3)
Total Bilirubin: 0.8 mg/dL (ref 0.2–1.2)

## 2014-10-30 LAB — POCT UA - MICROSCOPIC ONLY
Bacteria, U Microscopic: NEGATIVE
CASTS, UR, LPF, POC: NEGATIVE
Crystals, Ur, HPF, POC: NEGATIVE
MUCUS UA: NEGATIVE
RBC, URINE, MICROSCOPIC: NEGATIVE
WBC, UR, HPF, POC: NEGATIVE
YEAST UA: NEGATIVE

## 2014-10-30 LAB — POCT CBC
GRANULOCYTE PERCENT: 66.6 % (ref 37–80)
HCT, POC: 39.7 % — AB (ref 43.5–53.7)
Hemoglobin: 12.8 g/dL — AB (ref 14.1–18.1)
LYMPH, POC: 1.4 (ref 0.6–3.4)
MCH: 33.2 pg — AB (ref 27–31.2)
MCHC: 32.2 g/dL (ref 31.8–35.4)
MCV: 103.1 fL — AB (ref 80–97)
MID (cbc): 0.5 (ref 0–0.9)
MPV: 7.5 fL (ref 0–99.8)
POC Granulocyte: 3.7 (ref 2–6.9)
POC LYMPH PERCENT: 24.2 %L (ref 10–50)
POC MID %: 9.2 %M (ref 0–12)
Platelet Count, POC: 160 10*3/uL (ref 142–424)
RBC: 3.85 M/uL — AB (ref 4.69–6.13)
RDW, POC: 13.8 %
WBC: 5.6 10*3/uL (ref 4.6–10.2)

## 2014-10-30 LAB — POCT URINALYSIS DIPSTICK
BILIRUBIN UA: NEGATIVE
Blood, UA: NEGATIVE
Glucose, UA: NEGATIVE
Ketones, UA: NEGATIVE
Leukocytes, UA: NEGATIVE
NITRITE UA: NEGATIVE
Protein, UA: NEGATIVE
SPEC GRAV UA: 1.015
UROBILINOGEN UA: 0.2
pH, UA: 5.5

## 2014-10-30 LAB — FOLATE: Folate: >20 ng/mL

## 2014-10-30 LAB — VITAMIN B12: VITAMIN B 12: 331 pg/mL (ref 211–911)

## 2014-10-30 MED ORDER — POTASSIUM CHLORIDE CRYS ER 20 MEQ PO TBCR: 20.0000 meq | EXTENDED_RELEASE_TABLET | Freq: Every day | ORAL | Status: DC

## 2014-10-30 MED ORDER — FUROSEMIDE 40 MG PO TABS: 40.0000 mg | ORAL_TABLET | Freq: Every day | ORAL | Status: DC

## 2014-10-30 NOTE — Progress Notes (Deleted)
   Subjective:    Patient ID: Collin Rose, male    DOB: 02/06/1935, 79 y.o.   MRN: [id number]  HPI    Review of Systems     Objective:   Physical Exam        Assessment & Plan:   

## 2014-10-30 NOTE — Progress Notes (Addendum)
This chart was scribed for [name], MD by Tonye Royalty, ED Scribe. This patient was seen in room 2 and the patient's care was started at 12:09 PM.   Subjective:    Patient ID: Collin Rose, male    DOB: February 02, 1935, 80 y.o.   MRN: [id number]  HPI  HPI Comments: Collin Rose is a 80 y.o. male who presents to the [facility] complaining of leg swelling with onset 4-6 weeks ago. He states he has had this problem before and it comes and goes, but has been persistent this time. He denies leg pain. He uses Amlodipine. He denies history of DVTs. He had physical exam on June 2015. He reports prior study of blood circulation to his feet 2 years ago with benign results. He states he is having some ongoing back problems, followed by Dr. Myra Rude at [facility]. He denies SOB. He states he has not smoked in 40-50 years.  Review of Systems  Respiratory: Negative for shortness of breath.   Cardiovascular: Positive for leg swelling.  Musculoskeletal: Positive for back pain.       Objective:   Physical Exam  Nursing note and vitals reviewed.  CONSTITUTIONAL: Well developed/well nourished HEAD: Normocephalic/atraumatic EYES: EOMI/PERRL ENMT: Mucous membranes moist NECK: supple no meningeal signs SPINE/BACK:entire spine nontender CV: S1/S2 noted, no rubs/gallops noted, 2/6 systolic murmur to left systolic border LUNGS: Lungs are clear to auscultation bilaterally, no apparent distress ABDOMEN: soft, nontender, no rebound or guarding, bowel sounds noted throughout abdomen GU:no cva tenderness NEURO: Pt is awake/alert/appropriate, moves all extremitiesx4.  No facial droop.   EXTREMITIES: pulses normal/equal, full ROM, 4+ swelling which extends from the knees to the toes SKIN: warm, color normal PSYCH: no abnormalities of mood noted, alert and oriented to situation  Results for orders placed or performed in visit on 10/30/14  POCT CBC  Result Value Ref Range   WBC 5.6 4.6 - 10.2 K/uL   Lymph, poc 1.4 0.6 - 3.4   POC LYMPH PERCENT 24.2 10 - 50 %L   MID (cbc) 0.5 0 - 0.9   POC MID % 9.2 0 - 12 %M   POC Granulocyte 3.7 2 - 6.9   Granulocyte percent 66.6 37 - 80 %G   RBC 3.85 (A) 4.69 - 6.13 M/uL   Hemoglobin 12.8 (A) 14.1 - 18.1 g/dL   HCT, POC 30.8 (A) 65.7 - 53.7 %   MCV 103.1 (A) 80 - 97 fL   MCH, POC 33.2 (A) 27 - 31.2 pg   MCHC 32.2 31.8 - 35.4 g/dL   RDW, POC 84.6 %   Platelet Count, POC 160 142 - 424 K/uL   MPV 7.5 0 - 99.8 fL  POCT urinalysis dipstick  Result Value Ref Range   Color, UA yellow    Clarity, UA clear    Glucose, UA neg    Bilirubin, UA neg    Ketones, UA neg    Spec Grav, UA 1.015    Blood, UA neg    pH, UA 5.5    Protein, UA neg    Urobilinogen, UA 0.2    Nitrite, UA neg    Leukocytes, UA Negative   POCT UA - Microscopic Only  Result Value Ref Range   WBC, Ur, HPF, POC neg    RBC, urine, microscopic neg    Bacteria, U Microscopic neg    Mucus, UA neg    Epithelial cells, urine per micros 0-1  Crystals, Ur, HPF, POC neg    Casts, Ur, LPF, POC neg    Yeast, UA neg   [facility] reading (PRIMARY) by  Dr.Masey Scheiber heart size is borderline enlarged. There is no clear evidence of congestive heart failure please comment. EKG no acute changes    Assessment & Plan:  Patient has severe back pain. Routine labs were done. His urine looks normal. He has significant swelling which extends up to the thighs of both legs. Will check his BNP and make referral to cardiology to be sure he does not have failure. We will be on Lasix 40 one a day along with potassium 20 mEq a day. Doppler ordered of both legs. He is told to stop his amlodipine and stop his HCTZ Recheck here 1 week. He has put on 19 pounds since his last visit here in June 2015

## 2014-10-30 NOTE — Patient Instructions (Signed)

## 2014-10-31 ENCOUNTER — Other Ambulatory Visit (HOSPITAL_COMMUNITY): Payer: Self-pay | Admitting: Cardiology

## 2014-10-31 ENCOUNTER — Ambulatory Visit (HOSPITAL_COMMUNITY): Payer: Medicare Other | Attending: Emergency Medicine | Admitting: Cardiology

## 2014-10-31 DIAGNOSIS — M7989 Other specified soft tissue disorders: Secondary | ICD-10-CM

## 2014-10-31 DIAGNOSIS — R600 Localized edema: Secondary | ICD-10-CM

## 2014-10-31 DIAGNOSIS — R609 Edema, unspecified: Secondary | ICD-10-CM

## 2014-10-31 LAB — BRAIN NATRIURETIC PEPTIDE: Brain Natriuretic Peptide: 127.5 pg/mL — ABNORMAL HIGH (ref 0.0–100.0)

## 2014-10-31 NOTE — Progress Notes (Signed)
Bilateral lower venous duplex performed  

## 2014-11-06 ENCOUNTER — Ambulatory Visit: Payer: Medicare Other | Attending: Orthopedic Surgery | Admitting: Physical Therapy

## 2014-11-06 ENCOUNTER — Encounter: Payer: Self-pay | Admitting: Physical Therapy

## 2014-11-06 DIAGNOSIS — M545 Low back pain, unspecified: Secondary | ICD-10-CM

## 2014-11-06 NOTE — Patient Instructions (Signed)
Initial HEP = standing one and two hand low row with upright posture Black TB 10-20 reps 3+x/day

## 2014-11-06 NOTE — Therapy (Addendum)
[facility] [address]  [place], [place], [address] Phone: [phone]   Fax:  [phone]  Physical Therapy Evaluation  Patient Details  Name: Collin Rose MRN: [id number] Date of Birth: 06/17/1935 Referring Provider:  Latanya Maudlin, MD  Encounter Date: 11/06/2014      PT End of Session - 11/06/14 0945    Visit Number 1   Number of Visits 12   Date for PT Re-Evaluation 12/18/14   PT Start Time 0935   PT Stop Time 1015   PT Time Calculation (min) 40 min      Past Medical History  Diagnosis Date  . Hypertension   . Heart murmur     Past Surgical History  Procedure Laterality Date  . Spine surgery      There were no vitals filed for this visit.  Visit Diagnosis:  Midline low back pain without sciatica - Plan: PT plan of care cert/re-cert      Subjective Assessment - 11/06/14 0939    Symptoms pt with LBP since past winter.  States noted onset of pain 1-2 days after shoveling snow and believes that was cause of pain onset.  Pt states he has been sleeping in recliner since onset of pain due to pain with bed transfers.   Diagnostic tests x-ray from 2014 reveal spondylosis, recent MRI reveal small disc bulge and arthritis per pt report   Currently in Pain? Yes   Pain Score 5    Pain Location Back  pain localized to lower back, denies N/T or radiating pain   Pain Orientation Lower   Aggravating Factors  lying down, getting up from bed, sit->stand transfers   Pain Relieving Factors meds, rest   Multiple Pain Sites No            [facility] PT Assessment - 11/06/14 0001    Assessment   Medical Diagnosis lumbar facet arthritis   Onset Date 08/23/14   Balance Screen   Has the patient fallen in the past 6 months No   Has the patient had a decrease in activity level because of a fear of falling?  No   Is the patient reluctant to leave their home because of a fear of falling?  No   Home Environment    Living Enviornment Private residence   Living Arrangements Spouse/significant other   Type of [place] to enter   Entrance Stairs-Number of Steps 4   Prior Function   Vocation Retired   Leisure denies regular exercise   Observation/Other Assessments   Focus on Therapeutic Outcomes (FOTO)  47% limitation   Functional Tests   Functional tests Squat   Squat   Comments squats with wt shift to L (due to past R knee injury/surgery), FW wt shift lifting heels off flow, excessive trunk flexion   Posture/Postural Control   Posture Comments stands with guarded posture and maintains flexed trunk   ROM / Strength   AROM / PROM / Strength AROM;Strength   AROM   AROM Assessment Site Lumbar   Lumbar Flexion hands to mid shins - no pain   Lumbar Extension 50% normal - no pain   Strength   Overall Strength Comments B LE 5/5 grossly other than B Hip ABD 4/5 without c/o pain.  Unable to test Hip Ext due to unable to achieve prone lying and unable to perform Bridge motiont due to B HS cramping.   Flexibility  Soft Tissue Assessment /Muscle Length yes   Hamstrings B Tightness noted with SLR limited to 50-60 degrees   Piriformis B tightness   Bed Mobility   Bed Mobility Supine to Sit   Supine to Sit 4: Min assist   Supine to Sit Details (indicate cue type and reason) difficulty due to LBP and core weakness   Transfers   Transfers Sit to Stand   Sit to Stand 4: Min assist   Sit to Stand Details (indicate cue type and reason) requires Min A with sit->stand if coming from supine->sit due to LBP.  Once standing is able to abolish pain with upright standing.   Ambulation/Gait   Gait Comments slow, flexed posture, short B stride                           PT Education - 11/06/14 1017    Education provided Yes   Education Details initial HEP and POC   Person(s) Educated Patient   Methods Explanation;Demonstration   Comprehension Verbalized  understanding;Returned demonstration          PT Short Term Goals - 11/06/14 1358    PT SHORT TERM GOAL #1   Title pt able to lie supine and/or prone to allow treatments in these positions by 11/20/14   Status New   PT SHORT TERM GOAL #2   Title pt independent with initial HEP by 11/13/14   Status New           PT Long Term Goals - 11/06/14 1359    PT LONG TERM GOAL #1   Title pt independent with advanced HEP as necessary by 12/18/14   Status New   PT LONG TERM GOAL #2   Title pt able to sleep in bed by 12/18/14   Status New   PT LONG TERM GOAL #3   Title pt able to perform all transfers and bed mobility without limit by back pain by 12/18/14   Status New               Plan - 11/06/14 1346    Clinical Impression Statement pt with c/o LBP since shoveling snow this past winter.  He states his pain is intermittent in nature and is most typically noted with bed transfers and sometimes with sit->stand transfers.  Pt painfree for most of today's evaluation until it was time to get up from exam table.  Pt then required Min A to perform supine->sit and close SBA with sit->stand due to c/o LBP.  Once pt able to achieve upright standing pain abolished.  Given this type of symptom onset/provocation it seems pain is likely due to poor core stability/control along with underlying HNP (since upright standing/extension abolishes pain).  No pain but LOM present with lumbar extension so imaging finding of facet arthritis may not be primary source of pain.  will continue to assess.   Pt will benefit from skilled therapeutic intervention in order to improve on the following deficits Pain;Improper body mechanics;Postural dysfunction;Decreased strength;Decreased mobility   Rehab Potential Good  Fair to Good   Clinical Impairments Affecting Rehab Potential limited pain tolerance and high fear avoidance in that pt very reluctant to lie supine today   PT Frequency 1x / week  MD referral for 2-3x/wk  for 4 wks.  Pt states he cannot afford that frequent of session.   PT Duration 6 weeks   PT Treatment/Interventions Therapeutic exercise;Therapeutic activities;ADLs/Self Care Home Management;Patient/family education;Moist Heat;Electrical Stimulation;Manual  techniques;Ultrasound;Traction   PT Next Visit Plan seated and standing trunk stability with progression to supine and mechanical traction as able   Consulted and Agree with Plan of Care Patient          G-Codes - 29-Nov-2014 1345    Functional Assessment Tool Used FOTO   Functional Limitation Mobility: Walking and moving around   Mobility: Walking and Moving Around Current Status [phone]) At least 40 percent but less than 60 percent impaired, limited or restricted   Mobility: Walking and Moving Around Goal Status [phone]) At least 20 percent but less than 40 percent impaired, limited or restricted       Problem List Patient Active Problem List   Diagnosis Date Noted  . Post-polio muscle weakness 02/14/2014  . Hypertension 12/13/2011  . GERD (gastroesophageal reflux disease) 12/13/2011  . Neuropathy 12/13/2011    Nahsir Venezia PT, OCS November 29, 2014, 2:02 PM  [facility] [address]  [place], [place], [address] Phone: [phone]   Fax:  [phone]    PHYSICAL THERAPY DISCHARGE SUMMARY  Visits from Start of Care: initial evaluation only  Current functional level related to goals / functional outcomes: No change   Remaining deficits: No change from above eval   Education / Equipment: NA  Plan: Patient agrees to discharge.  Patient goals were not met. Patient is being discharged due to the patient's request.  ?????       Mr. Hyder called and cancelled his follow-up PT appointment and when asked if was planning on returning he stated "No" and requested discharge.  We are therefore discharging Mr. Harshaan from our care.  He was seen for initial evaluation only.

## 2014-11-13 ENCOUNTER — Ambulatory Visit: Payer: Medicare Other | Admitting: Rehabilitation

## 2014-11-20 ENCOUNTER — Telehealth: Payer: Self-pay

## 2014-11-20 NOTE — Telephone Encounter (Signed)
Left detailed message for pt that Dr Cleta Albertsaub had wanted pt to check back for f/up in a week (from 3/9 OV), and that he should get on in to see Dr Cleta Albertsaub or one of the other providers by tomorrow if possible. Advised that at his re-check the provider can send in further RFs to mail order if they want pt to remain on them. Asked for CB to let me know he got the message so that I will not worry about him following up.

## 2014-11-20 NOTE — Telephone Encounter (Signed)
Pt called back and reported that he is doing much better than 3 weeks ago. He stated he is doing "great!". Pt did not realize that Dr Cleta Albertsaub wanted to re-check him, and since he is doing well, he will wait to come see Dr Cleta Albertsaub next week when he returns. Pt stated he has plenty of medication until after he returns to make sure Dr Cleta Albertsaub does not want to change anything.

## 2014-11-20 NOTE — Telephone Encounter (Signed)
Dr. Cleta Albertsaub recently took pt off of two medicines and put him on two new ones, He states he would like to know if he needs to come back for a follow up, or if he will be on these medications permentaly. Would like to know if these could be called into [facility] mail for further refills. Please contact patient.   Pt stopped hydrochlorothiazide (HYDRODIURIL) 12.5 MG tablet [[id number]][[id number]] and amLODipine (NORVASC) 5 MG tablet [[id number]][[id number]]  Started furosemide (LASIX) 40 MG tablet [[id number]][[id number]] and potassium chloride SA (K-DUR,KLOR-CON) 20 MEQ tablet [[id number]][[id number]]

## 2014-11-27 ENCOUNTER — Ambulatory Visit (INDEPENDENT_AMBULATORY_CARE_PROVIDER_SITE_OTHER): Payer: Medicare Other | Admitting: Emergency Medicine

## 2014-11-27 VITALS — BP 148/98 | HR 59 | Temp 97.7°F | Resp 16 | Ht 75.0 in | Wt 261.0 lb

## 2014-11-27 DIAGNOSIS — R799 Abnormal finding of blood chemistry, unspecified: Secondary | ICD-10-CM

## 2014-11-27 DIAGNOSIS — R7989 Other specified abnormal findings of blood chemistry: Secondary | ICD-10-CM

## 2014-11-27 DIAGNOSIS — I10 Essential (primary) hypertension: Secondary | ICD-10-CM | POA: Diagnosis not present

## 2014-11-27 DIAGNOSIS — R609 Edema, unspecified: Secondary | ICD-10-CM | POA: Diagnosis not present

## 2014-11-27 LAB — BASIC METABOLIC PANEL
BUN: 15 mg/dL (ref 6–23)
CHLORIDE: 102 meq/L (ref 96–112)
CO2: 27 mEq/L (ref 19–32)
Calcium: 8.7 mg/dL (ref 8.4–10.5)
Creat: 0.95 mg/dL (ref 0.50–1.35)
GLUCOSE: 108 mg/dL — AB (ref 70–99)
Potassium: 4.7 mEq/L (ref 3.5–5.3)
SODIUM: 139 meq/L (ref 135–145)

## 2014-11-27 LAB — POCT CBC
Granulocyte percent: 66.8 %G (ref 37–80)
HEMATOCRIT: 42.1 % — AB (ref 43.5–53.7)
HEMOGLOBIN: 13.6 g/dL — AB (ref 14.1–18.1)
LYMPH, POC: 1.6 (ref 0.6–3.4)
MCH: 32.2 pg — AB (ref 27–31.2)
MCHC: 32.2 g/dL (ref 31.8–35.4)
MCV: 100 fL — AB (ref 80–97)
MID (cbc): 0.4 (ref 0–0.9)
MPV: 8.7 fL (ref 0–99.8)
POC Granulocyte: 4 (ref 2–6.9)
POC LYMPH PERCENT: 26.5 %L (ref 10–50)
POC MID %: 6.7 %M (ref 0–12)
Platelet Count, POC: 164 10*3/uL (ref 142–424)
RBC: 4.21 M/uL — AB (ref 4.69–6.13)
RDW, POC: 13.2 %
WBC: 6 10*3/uL (ref 4.6–10.2)

## 2014-11-27 MED ORDER — POTASSIUM CHLORIDE CRYS ER 20 MEQ PO TBCR: 20.0000 meq | EXTENDED_RELEASE_TABLET | Freq: Every day | ORAL | Status: DC

## 2014-11-27 MED ORDER — FUROSEMIDE 40 MG PO TABS: 40.0000 mg | ORAL_TABLET | Freq: Every day | ORAL | Status: DC

## 2014-11-27 NOTE — Progress Notes (Addendum)
Subjective:  This chart was scribed for Collin LitesSteve Seger Jani, MD by Richarda Overlieichard Holland, Medical scribe. This patient was seen in ROOM 12 and the patient's care was started 1:02 PM.   Patient ID: Collin Rose, male    DOB: 1934/10/18, 80 y.o.   MRN: [id number]   Chief Complaint  Patient presents with  . Follow-up    leg edema   HPI HPI Comments: Collin Rose is a 80 y.o. male with a history of HTN, neuropathy and GERD who [facility] for a follow-up for his leg edema from 1 month ago. Pt states he has lost 17lbs since his visit from 1 month ago and feels much better. He states his ankle and leg swelling has drastically improved. Pt has no complaints currently. He states he has been taking his medication as instructed. He reports no alleviating or exacerbating factors at this time. Patient's repeat blood pressure is 148/60.  He denies CP and SOB.    Past Medical History  Diagnosis Date  . Hypertension   . Heart murmur    Patient Active Problem List   Diagnosis Date Noted  . Post-polio muscle weakness 02/14/2014  . Hypertension 12/13/2011  . GERD (gastroesophageal reflux disease) 12/13/2011  . Neuropathy 12/13/2011   Current Outpatient Prescriptions on File Prior to Visit  Medication Sig Dispense Refill  . doxazosin (CARDURA) 8 MG tablet Take 1 tablet (8 mg total) by mouth at bedtime. Place on file so that patient has 1 year's worth of refills on this medicine. 90 tablet 1  . furosemide (LASIX) 40 MG tablet Take 1 tablet (40 mg total) by mouth daily. 30 tablet 3  . gabapentin (NEURONTIN) 300 MG capsule Patient to take one tablet at bedtime for 3 days then to increase to one in the morning and one at bedtime 180 capsule 0  . methocarbamol (ROBAXIN) 500 MG tablet Take 500 mg by mouth 3 (three) times daily.    . metoprolol (LOPRESSOR) 100 MG tablet Take 1 tablet (100 mg total) by mouth every morning. Place on file so that patient has 1 year's worth of refills on this medicine. 90 tablet 1  .  oxyCODONE-acetaminophen (PERCOCET) 10-325 MG per tablet Take 1 tablet by mouth every 4 (four) hours as needed for pain.    . potassium chloride SA (K-DUR,KLOR-CON) 20 MEQ tablet Take 1 tablet (20 mEq total) by mouth daily. 30 tablet 3  . amLODipine (NORVASC) 5 MG tablet Take 1 tablet (5 mg total) by mouth daily. (Patient not taking: Reported on 11/06/2014) 90 tablet 3  . hydrochlorothiazide (HYDRODIURIL) 12.5 MG tablet Take 1 tablet (12.5 mg total) by mouth daily. Place on file so that patient has 1 year's worth of refills on this medicine. (Patient not taking: Reported on 11/06/2014) 90 tablet 1   No current facility-administered medications on file prior to visit.   No Known Allergies Past Surgical History  Procedure Laterality Date  . Spine surgery     Family History  Problem Relation Age of Onset  . Cancer Mother   . Stroke Father    History   Social History  . Marital Status: Married    Spouse Name: [name]  . Number of Children: [name]  . Years of Education: [name]   Occupational History  . Not on file.   Social History Main Topics  . Smoking status: Former Smoker -- 1.00 packs/day    Types: Cigarettes  . Smokeless tobacco: Not on file  . Alcohol Use: Yes  .  Drug Use: Not on file  . Sexual Activity: Not on file   Other Topics Concern  . Not on file   Social History Narrative    Review of Systems  Respiratory: Negative for shortness of breath.   Cardiovascular: Negative for chest pain and leg swelling.  All other systems reviewed and are negative.     Objective:   Physical Exam  Constitutional: He is oriented to person, place, and time. He appears well-developed and well-nourished.  HENT:  Head: Normocephalic and atraumatic.  Eyes: Right eye exhibits no discharge. Left eye exhibits no discharge.  Neck: Neck supple. No tracheal deviation present.  Cardiovascular: Normal rate, regular rhythm and normal heart sounds.   Pulmonary/Chest: Effort normal. No respiratory  distress. He has no wheezes. He has no rales.  Decreased breath sounds in the bases.   Abdominal: He exhibits no distension.  Musculoskeletal:  Stasis changes of both legs but no appreciable swelling.   Neurological: He is alert and oriented to person, place, and time.  Skin: Skin is warm and dry.  Psychiatric: He has a normal mood and affect. His behavior is normal.  Nursing note and vitals reviewed.  Filed Vitals:   11/27/14 1227  BP: 148/98  Pulse: 59  Temp: 97.7 F (36.5 C)  TempSrc: Oral  Resp: 16  Height:  (1.905 m)  Weight: 261 lb (118.389 kg)  SpO2: 93%      Assessment & Plan:  Patient has lost 17 pounds since the addition of Lasix and potassium along with discontinuation of amlodipine and HCTZ. Will check a basic metabolic panel today along with a BNP and CBC. Recheck in one month.I personally performed the services described in this documentation, which was scribed in my presence. The recorded information has been reviewed and is accurate.

## 2014-11-27 NOTE — Patient Instructions (Signed)

## 2014-11-28 LAB — BRAIN NATRIURETIC PEPTIDE: Brain Natriuretic Peptide: 119.3 pg/mL — ABNORMAL HIGH (ref 0.0–100.0)

## 2014-12-28 ENCOUNTER — Ambulatory Visit (INDEPENDENT_AMBULATORY_CARE_PROVIDER_SITE_OTHER): Payer: Medicare Other | Admitting: Emergency Medicine

## 2014-12-28 VITALS — BP 172/76 | HR 48 | Temp 97.5°F | Resp 18 | Ht 75.0 in | Wt 264.2 lb

## 2014-12-28 DIAGNOSIS — R609 Edema, unspecified: Secondary | ICD-10-CM | POA: Diagnosis not present

## 2014-12-28 DIAGNOSIS — I10 Essential (primary) hypertension: Secondary | ICD-10-CM

## 2014-12-28 NOTE — Progress Notes (Addendum)
   Subjective:  This chart was scribed for Earl LitesSteve Tatanisha Cuthbert, MD by [facility] Center[facility]adim Abu Hashem, medical scribe at [facility].The patient was seen in exam room 13 and the patient's care was started at 9:11 AM.   Patient ID: Collin Rose, male    DOB: Jun 09, 1935, 80 y.o.   MRN: [id number] Chief Complaint  Patient presents with  . Follow-up    for HTN   HPI HPI Comments: Collin Rose is a 80 y.o. male who presents to [facility] for a follow up. Last seen on 11/27/2014 where his blood pressure was elevated at 148/90 and both legs were swollen. Patient had lost 17 pounds since the addition of Lasix and potassium along with discontinuation of amlodipine and HCTZ at last visit. Pt is feeling much better today and legs have improved. Blood pressure is 138/64 after left arm recheck, right arm is 140/70 today. He denies chest pain, light headed spells, shortness of breath and headaches  Review of Systems  Respiratory: Negative for shortness of breath.   Cardiovascular: Negative for chest pain.  Neurological: Negative for light-headedness and headaches.      Objective:  BP 172/76 mmHg  Pulse 48  Temp(Src) 97.5 F (36.4 C) (Oral)  Resp 18  Ht 6\' 3"  (1.905 m)  Wt 264 lb 3.2 oz (119.84 kg)  BMI 33.02 kg/m2  SpO2 98% Physical Exam  Nursing note and vitals reviewed. CONSTITUTIONAL: Well developed/well nourished HEAD: Normocephalic/atraumatic EYES: EOMI/PERRL ENMT: Mucous membranes moist NECK: supple no meningeal signs SPINE/BACK:entire spine nontender CV: S1/S2 noted, no murmurs/rubs/gallops noted LUNGS: Lungs are clear to auscultation bilaterally, no apparent distress there are decreased breath sounds in the bases but no wheezes or crackles. ABDOMEN: soft, nontender, no rebound or guarding, bowel sounds noted throughout abdomen GU:no cva tenderness NEURO: Pt is awake/alert/appropriate, moves all extremitiesx4.  No facial droop.   EXTREMITIES: pulses normal/equal, full  ROM there is 1+ swelling of the lower extremities. SKIN: warm, color normal PSYCH: no abnormalities of mood noted, alert and oriented to situation    Assessment & Plan:  1. Edema He will continue Lasix and potassium supplementation every other day.  2. Essential hypertension No change in dose of Lopressor. He will schedule an appointment for a full physical in 3 months I repeated his blood pressures and they were better. He is on Lopressor 100 mg a day with a heart rate of 48 so this could not be increased. I still think it would build safest to continue Lasix and potassium every other day.  I personally performed the services described in this documentation, which was scribed in my presence. The recorded information has been reviewed and is accurate.  Lesle ChrisSteven Clavin Ruhlman, MD  [facility], [facility]  12/28/2014 9:28 AM

## 2014-12-28 NOTE — Progress Notes (Deleted)
   Subjective:    Patient ID: Collin Rose, male    DOB: [birth year], 80 y.o.   MRN: [id number]  HPI    Review of Systems     Objective:   Physical Exam        Assessment & Plan:

## 2015-01-13 ENCOUNTER — Other Ambulatory Visit: Payer: Self-pay | Admitting: Emergency Medicine

## 2015-01-14 NOTE — Telephone Encounter (Signed)
Dr Cleta Albertsaub, you saw pt recently, but I don't see gabapentin Rxd since 12/2013 with no RFs, or discussed recently. Do you want to give RFs?

## 2015-01-14 NOTE — Telephone Encounter (Signed)
Please have patient see Dr. Cleta Albertsaub at walk in clinic or appt center for complete physical in 3 months as discussed during his last visit. I will refill his gabapentin for 3 months.

## 2015-01-22 ENCOUNTER — Ambulatory Visit (INDEPENDENT_AMBULATORY_CARE_PROVIDER_SITE_OTHER): Payer: Medicare Other | Admitting: Emergency Medicine

## 2015-01-22 VITALS — BP 130/60 | HR 48 | Temp 98.4°F | Resp 18 | Ht 76.75 in | Wt 264.2 lb

## 2015-01-22 DIAGNOSIS — R609 Edema, unspecified: Secondary | ICD-10-CM

## 2015-01-22 LAB — BASIC METABOLIC PANEL WITH GFR
BUN: 16 mg/dL (ref 6–23)
CALCIUM: 8.7 mg/dL (ref 8.4–10.5)
CHLORIDE: 102 meq/L (ref 96–112)
CO2: 29 meq/L (ref 19–32)
Creat: 0.98 mg/dL (ref 0.50–1.35)
GFR, Est African American: 86 mL/min
GFR, Est Non African American: 74 mL/min
Glucose, Bld: 100 mg/dL — ABNORMAL HIGH (ref 70–99)
Potassium: 5.1 mEq/L (ref 3.5–5.3)
SODIUM: 139 meq/L (ref 135–145)

## 2015-01-22 LAB — POCT CBC
Granulocyte percent: 69.4 %G (ref 37–80)
HCT, POC: 42.7 % — AB (ref 43.5–53.7)
HEMOGLOBIN: 14 g/dL — AB (ref 14.1–18.1)
Lymph, poc: 1.4 (ref 0.6–3.4)
MCH, POC: 32.5 pg — AB (ref 27–31.2)
MCHC: 32.8 g/dL (ref 31.8–35.4)
MCV: 99.3 fL — AB (ref 80–97)
MID (CBC): 0.4 (ref 0–0.9)
MPV: 7.7 fL (ref 0–99.8)
PLATELET COUNT, POC: 176 10*3/uL (ref 142–424)
POC Granulocyte: 4 (ref 2–6.9)
POC LYMPH PERCENT: 24.1 %L (ref 10–50)
POC MID %: 6.5 %M (ref 0–12)
RBC: 4.3 M/uL — AB (ref 4.69–6.13)
RDW, POC: 14 %
WBC: 5.7 10*3/uL (ref 4.6–10.2)

## 2015-01-22 LAB — CK: CK TOTAL: 81 U/L (ref 7–232)

## 2015-01-22 NOTE — Patient Instructions (Signed)
I will call you tomorrow with the results of your potassium level. Do not take anymore Lasix until I speak with you.

## 2015-01-22 NOTE — Progress Notes (Addendum)
Subjective:    Patient ID: Collin Rose, male    DOB: [birth year], 80 y.o.   MRN: [id number] This chart was scribed for Ethelda Chick, MD by [place] Peace, ED Scribe. The patient was seen in RM08. The patient's care was started at 9:59 AM.  Chief Complaint  Patient presents with  . Medication Consultation    Pt have questions about his medication     HPI  HPI Comments: Collin Rose is a 80 y.o. male who presents to the [facility] seeking medication consultation. He is currently complaining of new onset of bilateral leg soreness, exacerbated by walking longer distances or up any steps. He reports that his legs feel "lazy". He denies any feelings of "weakness" or "cramping" in his legs. No complaints of dizziness or light-headedness. Pt was put on diuretics to address previous complaint of leg swelling which he reports has improved.     Weight today: 264 lbs, Weight at last visit: 261 lbs. Heart rate of 48 here at [facility].    Past Medical History  Diagnosis Date  . Hypertension   . Heart murmur    Past Surgical History  Procedure Laterality Date  . Spine surgery     No Known Allergies Current Outpatient Prescriptions on File Prior to Visit  Medication Sig Dispense Refill  . doxazosin (CARDURA) 8 MG tablet Take 1 tablet (8 mg total) by mouth at bedtime. Place on file so that patient has 1 year's worth of refills on this medicine. 90 tablet 1  . furosemide (LASIX) 40 MG tablet Take 1 tablet (40 mg total) by mouth daily. 90 tablet 3  . gabapentin (NEURONTIN) 300 MG capsule Take 1 capsule (300 mg total) by mouth 2 (two) times daily. 90 capsule 0  . metoprolol (LOPRESSOR) 100 MG tablet Take 1 tablet (100 mg total) by mouth every morning. Place on file so that patient has 1 year's worth of refills on this medicine. 90 tablet 1  . potassium chloride SA (K-DUR,KLOR-CON) 20 MEQ tablet Take 1 tablet (20 mEq total) by mouth daily. 90 tablet 3   No current facility-administered medications on file  prior to visit.       Review of Systems  Cardiovascular: Negative for leg swelling.  Musculoskeletal: Positive for arthralgias.       Bilateral leg "soreness"  Neurological: Negative for dizziness, weakness, light-headedness and numbness.       Objective:   Physical Exam  Constitutional: He is oriented to person, place, and time. He appears well-developed and well-nourished. No distress.  HENT:  Head: Normocephalic and atraumatic.  Eyes: Conjunctivae and EOM are normal.  Neck: Neck supple. No tracheal deviation present.  Cardiovascular: Normal rate.   Pulses:      Dorsalis pedis pulses are 2+ on the right side, and 2+ on the left side.  Pulmonary/Chest: Effort normal. No respiratory distress.  Musculoskeletal: Normal range of motion. He exhibits edema and tenderness.  Trace edema with mild soreness in both calves. There is atrophy of the calf muscles on the left.   Neurological: He is alert and oriented to person, place, and time.  Skin: Skin is warm and dry.  Psychiatric: He has a normal mood and affect. His behavior is normal.  Nursing note and vitals reviewed.   Filed Vitals:   01/22/15 0918  BP: 130/60  Pulse: 48  Temp: 98.4 F (36.9 C)  Resp: 18   Results for orders placed or performed in visit on 01/22/15  POCT CBC  Result Value Ref Range   WBC 5.7 4.6 - 10.2 K/uL   Lymph, poc 1.4 0.6 - 3.4   POC LYMPH PERCENT 24.1 10 - 50 %L   MID (cbc) 0.4 0 - 0.9   POC MID % 6.5 0 - 12 %M   POC Granulocyte 4.0 2 - 6.9   Granulocyte percent 69.4 37 - 80 %G   RBC 4.30 (A) 4.69 - 6.13 M/uL   Hemoglobin 14.0 (A) 14.1 - 18.1 g/dL   HCT, POC 16.142.7 (A) 09.643.5 - 53.7 %   MCV 99.3 (A) 80 - 97 fL   MCH, POC 32.5 (A) 27 - 31.2 pg   MCHC 32.8 31.8 - 35.4 g/dL   RDW, POC 04.514.0 %   Platelet Count, POC 176 142 - 424 K/uL   MPV 7.7 0 - 99.8 fL    1 if the potassium level was normal order a venous ultrasound and arterial Dopplers of the lower extremities.0:06 AM- Treatment plan was  discussed with patient who verbalizes understanding and agrees.       Assessment & Plan:  Patient having pain in both calves. He does not have significant swelling. There is post polio wasting involving the left leg. I suspect his symptoms are secondary to hypokalemia. I will have him hold his Lasix and potassium for now. I did order a basic metabolic panel.I personally performed the services described in this documentation, which was scribed in my presence. The recorded information has been reviewed and is accurate.  Earl LitesSteve Daub, MD

## 2015-03-24 ENCOUNTER — Other Ambulatory Visit: Payer: Self-pay | Admitting: Urgent Care

## 2015-04-09 ENCOUNTER — Ambulatory Visit (INDEPENDENT_AMBULATORY_CARE_PROVIDER_SITE_OTHER): Payer: Medicare Other | Admitting: Emergency Medicine

## 2015-04-09 VITALS — BP 142/76 | HR 46 | Temp 98.3°F | Resp 18 | Ht 76.0 in | Wt 261.0 lb

## 2015-04-09 DIAGNOSIS — I739 Peripheral vascular disease, unspecified: Secondary | ICD-10-CM

## 2015-04-09 DIAGNOSIS — M25569 Pain in unspecified knee: Secondary | ICD-10-CM | POA: Diagnosis not present

## 2015-04-09 DIAGNOSIS — R001 Bradycardia, unspecified: Secondary | ICD-10-CM | POA: Diagnosis not present

## 2015-04-09 LAB — BASIC METABOLIC PANEL WITH GFR
BUN: 15 mg/dL (ref 7–25)
CHLORIDE: 100 mmol/L (ref 98–110)
CO2: 29 mmol/L (ref 20–31)
Calcium: 9.3 mg/dL (ref 8.6–10.3)
Creat: 0.89 mg/dL (ref 0.70–1.18)
GFR, Est African American: >89 mL/min (ref >=60)
GFR, Est Non African American: 82 mL/min (ref >=60)
GLUCOSE: 97 mg/dL (ref 65–99)
Potassium: 5.2 mmol/L (ref 3.5–5.3)
Sodium: 139 mmol/L (ref 135–146)

## 2015-04-09 LAB — POCT CBC
Granulocyte percent: 68.2 %G (ref 37–80)
HEMATOCRIT: 46.2 % (ref 43.5–53.7)
Hemoglobin: 14.9 g/dL (ref 14.1–18.1)
LYMPH, POC: 1.4 (ref 0.6–3.4)
MCH, POC: 32.1 pg — AB (ref 27–31.2)
MCHC: 32.2 g/dL (ref 31.8–35.4)
MCV: 99.7 fL — AB (ref 80–97)
MID (CBC): 0.4 (ref 0–0.9)
MPV: 8.2 fL (ref 0–99.8)
POC Granulocyte: 3.9 (ref 2–6.9)
POC LYMPH %: 25.3 % (ref 10–50)
POC MID %: 6.5 % (ref 0–12)
Platelet Count, POC: 137 10*3/uL — AB (ref 142–424)
RBC: 4.63 M/uL — AB (ref 4.69–6.13)
RDW, POC: 13.3 %
WBC: 5.7 10*3/uL (ref 4.6–10.2)

## 2015-04-09 LAB — GLUCOSE, POCT (MANUAL RESULT ENTRY): POC Glucose: 111 mg/dl — AB (ref 70–99)

## 2015-04-09 LAB — CK: CK TOTAL: 82 U/L (ref 7–232)

## 2015-04-09 NOTE — Progress Notes (Signed)
Subjective:  This chart was scribed for [name], by [name], at [facility].  This patient was seen in room 9and the patient's care was started at 9:57 AM.    Patient ID: Collin Rose, male    DOB: [birth year], 80 y.o.   MRN: [id number] Chief Complaint  Patient presents with  . Follow-up    leg pain     HPI  HPI Comments: Collin Rose is a 80 y.o. male who presents to the [facility] complaining of leg weakness and states that they are very sore and "lazy" onset June 1st of this year. His weakness in his legs start when he starts walking and states that he does not walk long distances anymore.  He denies any pain while sitting.  He was taken off his potassium and Lasix last visit. Patient states that he does not ever get the feeling that he will have a syncopic episode and denies any cold sensation in his feet.  He does not see Dr. Jacinto Halim regularly.  Patient quit smoking about 40 years ago.  He is compliant with his his metoprolol and gabapentin.  Patient used to work in a [employer].    He had an echocardiogram by Dr. Jacinto Halim. He had a doppler done in 2014 that showed mild diastolic function but otherwise was normal.  Patient has had swelling in his legs in the past.     Patient Active Problem List   Diagnosis Date Noted  . Bradycardia 04/09/2015  . Post-polio muscle weakness 02/14/2014  . Hypertension 12/13/2011  . GERD (gastroesophageal reflux disease) 12/13/2011  . Neuropathy 12/13/2011   Past Medical History  Diagnosis Date  . Hypertension   . Heart murmur    Past Surgical History  Procedure Laterality Date  . Spine surgery     No Known Allergies Prior to Admission medications   Medication Sig Start Date End Date Taking? Authorizing Provider  doxazosin (CARDURA) 8 MG tablet Take 1 tablet (8 mg total) by mouth at bedtime. Place on file so that patient has 1 year's worth of refills on this medicine. 05/11/14  Yes [name], MD  gabapentin (NEURONTIN) 300 MG capsule TAKE 1 (300 MG TOTAL) BY MOUTH 2 TIMES DAILY 03/24/15  Yes [name], MD  metoprolol (LOPRESSOR) 100 MG tablet Take 1 tablet (100 mg total) by mouth every morning. Place on file so that patient has 1 year's worth of refills on this medicine. 05/11/14  Yes [name], MD  furosemide (LASIX) 40 MG tablet Take 1 tablet (40 mg total) by mouth daily. 11/27/14   [name], MD  potassium chloride SA (K-DUR,KLOR-CON) 20 MEQ tablet Take 1 tablet (20 mEq total) by mouth daily. 11/27/14   [name], MD   Social History   Social History  . Marital Status: Married    Spouse Name: [name]  . Number of Children: [name]  . Years of Education: [name]   Occupational History  . Not on file.   Social History Main Topics  . Smoking status: Former Smoker -- 1.00 packs/day    Types: Cigarettes  . Smokeless tobacco: Not on file  . Alcohol Use: Yes  . Drug Use: Not on file  . Sexual Activity: Not on file   Other Topics Concern  . Not on file   Social History Narrative     Current Outpatient Prescriptions on File Prior to Visit  Medication Sig  Dispense Refill  . doxazosin (CARDURA) 8 MG tablet Take 1 tablet (8 mg total) by mouth at bedtime. Place on file so that patient has 1 year's worth of refills on this medicine. 90 tablet 1  . gabapentin (NEURONTIN) 300 MG capsule TAKE 1 (300 MG TOTAL) BY MOUTH 2 TIMES DAILY 180 capsule 0  . metoprolol (LOPRESSOR) 100 MG tablet Take 1 tablet (100 mg total) by mouth every morning. Place on file so that patient has 1 year's worth of refills on this medicine. 90 tablet 1   No current facility-administered medications on file prior to visit.    No Known Allergies     Review of Systems  Constitutional: Negative for fever and chills.  HENT: Negative for nosebleeds, rhinorrhea and sore throat.   Eyes: Negative for pain, discharge and itching.  Gastrointestinal: Negative for nausea and vomiting.  Musculoskeletal:  Positive for myalgias.  Neurological: Positive for weakness. Negative for syncope.       Objective:   Physical Exam  CONSTITUTIONAL: Well developed/well nourished HEAD: Normocephalic/atraumatic EYES: EOMI/PERRL ENMT: Mucous membranes moist NECK: supple no meningeal signs SPINE/BACK:entire spine nontender CV: S1/S2 noted, no murmurs/rubs/gallops noted LUNGS: Lungs are clear to auscultation bilaterally, no apparent distress ABDOMEN: soft, nontender, no rebound or guarding, bowel sounds noted throughout abdomen GU:no cva tenderness NEURO: Pt is awake/alert/appropriate, moves all extremitiesx4.  No facial droop.   EXTREMITIES: He has 1+ dorsalis pedis pulses bilaterally, No posterior tibial pulses are palpable.  SKIN: warm, color normal PSYCH: no abnormalities of mood noted, alert and oriented to situation   Filed Vitals:   04/09/15 0915  BP: 142/76  Pulse: 46  Temp: 98.3 F (36.8 C)  TempSrc: Oral  Resp: 18  Height:  (1.93 m)  Weight: 261 lb (118.389 kg)  SpO2: 96%         Assessment & Plan:  Patient complaining of pain and discomfort in his legs. He states they are also weak. He has post polio syndrome. He is currently on Neurontin twice a day. He had diminished pulses in his feet so will go ahead with an arterial study. Labs were done today. He will continue off of the diuretic some potassium. His last potassium was normal.I personally performed the services described in this documentation, which was scribed in my presence. The recorded information has been reviewed and is accurate.  Earl Lites, MD

## 2015-04-09 NOTE — Progress Notes (Signed)
Subjective:  This chart was scribed for Collin Chris MD, by Collin Rose, at [facility].  This patient was seen in room 9and the patient's care was started at 9:57 AM.    Patient ID: Collin Rose, male    DOB: Jan 07, 1935, 81 y.o.   MRN: [id number] Chief Complaint  Patient presents with   Follow-up    leg pain     HPI  HPI Comments: Collin Rose is a 80 y.o. male who presents to the [facility] complaining of leg weakness and states that they are very sore and "lazy" onset June 1st of this year. His weakness in his legs start when he starts walking and states that he does not walk long distances anymore.  He denies any pain while sitting.  He was taken off his potassium and Lasix last visit. Patient states that he does not ever get the feeling that he will have a syncopic episode and denies any cold sensation in his feet.  He does not see Dr. Jacinto Halim regularly.  Patient quit smoking about 40 years ago.  He is compliant with his his metoprolol and gabapentin.  Patient used to work in a [employer].    He had an echocardiogram by Dr. Jacinto Halim. He had a doppler done in 2014 that showed mild diastolic function but otherwise was normal.  Patient has had swelling in his legs in the past.     Patient Active Problem List   Diagnosis Date Noted   Post-polio muscle weakness 02/14/2014   Hypertension 12/13/2011   GERD (gastroesophageal reflux disease) 12/13/2011   Neuropathy 12/13/2011   Past Medical History  Diagnosis Date   Hypertension    Heart murmur    Past Surgical History  Procedure Laterality Date   Spine surgery     No Known Allergies Prior to Admission medications   Medication Sig Start Date End Date Taking? Authorizing Provider  doxazosin (CARDURA) 8 MG tablet Take 1 tablet (8 mg total) by mouth at bedtime. Place on file so that patient has 1 year's worth of refills on this medicine. 05/11/14  Yes [name], MD  gabapentin  (NEURONTIN) 300 MG capsule TAKE 1 (300 MG TOTAL) BY MOUTH 2 TIMES DAILY 03/24/15  Yes [name], MD  metoprolol (LOPRESSOR) 100 MG tablet Take 1 tablet (100 mg total) by mouth every morning. Place on file so that patient has 1 year's worth of refills on this medicine. 05/11/14  Yes [name], MD  furosemide (LASIX) 40 MG tablet Take 1 tablet (40 mg total) by mouth daily. 11/27/14   [name], MD  potassium chloride SA (K-DUR,KLOR-CON) 20 MEQ tablet Take 1 tablet (20 mEq total) by mouth daily. 11/27/14   [name], MD   Social History   Social History   Marital Status: Married    Spouse Name: [name]   Number of Children: [name]   Years of Education: [name]   Occupational History   Not on file.   Social History Main Topics   Smoking status: Former Smoker -- 1.00 packs/day    Types: Cigarettes   Smokeless tobacco: Not on file   Alcohol Use: Yes   Drug Use: Not on file   Sexual Activity: Not on file   Other Topics Concern   Not on file   Social History Narrative     Current Outpatient Prescriptions on File Prior to Visit  Medication Sig Dispense Refill  doxazosin (CARDURA) 8 MG tablet Take 1 tablet (8 mg total) by mouth at bedtime. Place on file so that patient has 1 year's worth of refills on this medicine. 90 tablet 1   gabapentin (NEURONTIN) 300 MG capsule TAKE 1 (300 MG TOTAL) BY MOUTH 2 TIMES DAILY 180 capsule 0   metoprolol (LOPRESSOR) 100 MG tablet Take 1 tablet (100 mg total) by mouth every morning. Place on file so that patient has 1 year's worth of refills on this medicine. 90 tablet 1   furosemide (LASIX) 40 MG tablet Take 1 tablet (40 mg total) by mouth daily. 90 tablet 3   potassium chloride SA (K-DUR,KLOR-CON) 20 MEQ tablet Take 1 tablet (20 mEq total) by mouth daily. 90 tablet 3   No current facility-administered medications on file prior to visit.    No Known Allergies     Review of Systems  Constitutional: Negative for fever and chills.    HENT: Negative for nosebleeds, rhinorrhea and sore throat.   Eyes: Negative for pain, discharge and itching.  Gastrointestinal: Negative for nausea and vomiting.  Musculoskeletal: Positive for myalgias.  Neurological: Positive for weakness. Negative for syncope.       Objective:   Physical Exam  CONSTITUTIONAL: Well developed/well nourished HEAD: Normocephalic/atraumatic EYES: EOMI/PERRL ENMT: Mucous membranes moist NECK: supple no meningeal signs SPINE/BACK:entire spine nontender CV: S1/S2 noted, no murmurs/rubs/gallops noted LUNGS: Lungs are clear to auscultation bilaterally, no apparent distress ABDOMEN: soft, nontender, no rebound or guarding, bowel sounds noted throughout abdomen GU:no cva tenderness NEURO: Pt is awake/alert/appropriate, moves all extremitiesx4.  No facial droop.   EXTREMITIES: He has 1+ dorsalis pedis pulses bilaterally, No posterior tibial pulses are palpable.  SKIN: warm, color normal PSYCH: no abnormalities of mood noted, alert and oriented to situation   Filed Vitals:   04/09/15 0915  BP: 142/76  Pulse: 46  Temp: 98.3 F (36.8 C)  TempSrc: Oral  Resp: 18  Height:  (1.93 m)  Weight: 261 lb (118.389 kg)  SpO2: 96%         Assessment & Plan:  Patient complaining of pain and discomfort in his legs. He states they are also weak. He has post polio syndrome. He is currently on Neurontin twice a day. He had diminished pulses in his feet so will go ahead with an arterial study. Labs were done today. He will continue off of the diuretic some potassium. His last potassium was normal.I personally performed the services described in this documentation, which was scribed in my presence. The recorded information has been reviewed and is accurate.  Earl Lites, MD

## 2015-04-10 ENCOUNTER — Encounter: Payer: Self-pay | Admitting: Family Medicine

## 2015-04-11 ENCOUNTER — Other Ambulatory Visit: Payer: Self-pay

## 2015-04-11 ENCOUNTER — Ambulatory Visit (HOSPITAL_BASED_OUTPATIENT_CLINIC_OR_DEPARTMENT_OTHER)
Admission: RE | Admit: 2015-04-11 | Discharge: 2015-04-11 | Disposition: A | Discharge status: Home / Self Care | Payer: Medicare Other | Source: Ambulatory Visit

## 2015-04-11 ENCOUNTER — Telehealth: Payer: Self-pay

## 2015-04-11 ENCOUNTER — Ambulatory Visit (HOSPITAL_COMMUNITY)
Admission: RE | Admit: 2015-04-11 | Discharge: 2015-04-11 | Disposition: A | Discharge status: Home / Self Care | Payer: Medicare Other | Source: Ambulatory Visit | Attending: Emergency Medicine | Admitting: Emergency Medicine

## 2015-04-11 DIAGNOSIS — I739 Peripheral vascular disease, unspecified: Secondary | ICD-10-CM | POA: Insufficient documentation

## 2015-04-11 NOTE — Telephone Encounter (Signed)
[facility] called to report that prelim on ABI was normal, therefore LE Arterial not needed.   Reported, however that pt's systolic BP elevated to 203 during procedure and afterward BP was 190/81. I spoke to Michel Harrow, to get instr's for pt and had Lab advise him to come by here for a BP check. If still elevated over normal, pt needs to be seen. If BP has returned to normal Kathlene November will be consulted to see if pt still needs to check in to be evaluated. Advised TL Erin of plan. Dr Cleta Alberts, Lorain Childes.

## 2015-04-11 NOTE — Progress Notes (Signed)
VASCULAR LAB PRELIMINARY  ARTERIAL  ABI completed:    RIGHT    LEFT    PRESSURE WAVEFORM  PRESSURE WAVEFORM  BRACHIAL 202 Triphasic BRACHIAL 198 Triphasic  DP 249 Triphsaic DP 250 Triphasic  AT   AT    PT 234 Triphasic PT Noncompressible Triphasic  PER   PER    GREAT TOE  NA GREAT TOE  NA    RIGHT LEFT  ABI 1.23 1.24   Bilateral ABIs are within normal limits. The left posterior tibial artery was noncompressible, however given the brachial artery pressure of 202 this may be due to equipment limitations.  Dr.Daub's office called with preliminary results and vital signs. Blood pressure was repeated and was 190/81, 49 BPM, 93% O2. Patient was instructed to return to Dr. Ellis Parents office.  04/11/2015 9:37 AM Gertie Fey, RVT, RDCS, RDMS

## 2015-04-12 NOTE — Telephone Encounter (Signed)
Please call patient and be sure he has followed up on his elevated blood pressure. If it remains elevated he needs to be seen in our office.

## 2015-04-16 NOTE — Telephone Encounter (Signed)
Lm following up on BP readings

## 2015-05-26 ENCOUNTER — Ambulatory Visit (INDEPENDENT_AMBULATORY_CARE_PROVIDER_SITE_OTHER): Payer: Medicare Other | Admitting: *Deleted

## 2015-05-26 DIAGNOSIS — Z23 Encounter for immunization: Secondary | ICD-10-CM

## 2015-05-27 ENCOUNTER — Encounter: Payer: Self-pay | Admitting: Emergency Medicine

## 2015-07-28 ENCOUNTER — Other Ambulatory Visit: Payer: Self-pay | Admitting: Emergency Medicine

## 2015-08-21 ENCOUNTER — Telehealth: Payer: Self-pay | Admitting: *Deleted

## 2015-08-21 ENCOUNTER — Telehealth: Payer: Self-pay

## 2015-08-21 ENCOUNTER — Ambulatory Visit (INDEPENDENT_AMBULATORY_CARE_PROVIDER_SITE_OTHER): Payer: Medicare Other | Admitting: Emergency Medicine

## 2015-08-21 ENCOUNTER — Encounter: Payer: Self-pay | Admitting: Emergency Medicine

## 2015-08-21 ENCOUNTER — Other Ambulatory Visit: Payer: Self-pay | Admitting: Emergency Medicine

## 2015-08-21 VITALS — BP 134/71 | HR 50 | Temp 97.5°F | Resp 16 | Ht 76.0 in | Wt 264.0 lb

## 2015-08-21 DIAGNOSIS — Z0000 Encounter for general adult medical examination without abnormal findings: Secondary | ICD-10-CM | POA: Diagnosis not present

## 2015-08-21 DIAGNOSIS — E785 Hyperlipidemia, unspecified: Secondary | ICD-10-CM

## 2015-08-21 DIAGNOSIS — Z125 Encounter for screening for malignant neoplasm of prostate: Secondary | ICD-10-CM

## 2015-08-21 DIAGNOSIS — G63 Polyneuropathy in diseases classified elsewhere: Secondary | ICD-10-CM | POA: Diagnosis not present

## 2015-08-21 DIAGNOSIS — R601 Generalized edema: Secondary | ICD-10-CM

## 2015-08-21 DIAGNOSIS — A809 Acute poliomyelitis, unspecified: Secondary | ICD-10-CM

## 2015-08-21 DIAGNOSIS — D539 Nutritional anemia, unspecified: Secondary | ICD-10-CM

## 2015-08-21 DIAGNOSIS — I10 Essential (primary) hypertension: Secondary | ICD-10-CM

## 2015-08-21 DIAGNOSIS — I739 Peripheral vascular disease, unspecified: Secondary | ICD-10-CM

## 2015-08-21 LAB — CBC WITH DIFFERENTIAL/PLATELET
Basophils Absolute: 0.1 10*3/uL (ref 0.0–0.1)
Basophils Relative: 1 % (ref 0–1)
EOS ABS: 0.2 10*3/uL (ref 0.0–0.7)
Eosinophils Relative: 4 % (ref 0–5)
HEMATOCRIT: 42 % (ref 39.0–52.0)
Hemoglobin: 14.2 g/dL (ref 13.0–17.0)
LYMPHS ABS: 1.4 10*3/uL (ref 0.7–4.0)
LYMPHS PCT: 28 % (ref 12–46)
MCH: 34.1 pg — AB (ref 26.0–34.0)
MCHC: 33.8 g/dL (ref 30.0–36.0)
MCV: 101 fL — AB (ref 78.0–100.0)
MONOS PCT: 9 % (ref 3–12)
MPV: 11.5 fL (ref 8.6–12.4)
Monocytes Absolute: 0.5 10*3/uL (ref 0.1–1.0)
NEUTROS PCT: 58 % (ref 43–77)
Neutro Abs: 2.9 10*3/uL (ref 1.7–7.7)
PLATELETS: 176 10*3/uL (ref 150–400)
RBC: 4.16 MIL/uL — ABNORMAL LOW (ref 4.22–5.81)
RDW: 13 % (ref 11.5–15.5)
WBC: 5 10*3/uL (ref 4.0–10.5)

## 2015-08-21 LAB — POC MICROSCOPIC URINALYSIS (UMFC): MUCUS RE: ABSENT

## 2015-08-21 LAB — COMPLETE METABOLIC PANEL WITH GFR
ALBUMIN: 4 g/dL (ref 3.6–5.1)
ALT: 13 U/L (ref 9–46)
AST: 18 U/L (ref 10–35)
Alkaline Phosphatase: 83 U/L (ref 40–115)
BUN: 14 mg/dL (ref 7–25)
CHLORIDE: 103 mmol/L (ref 98–110)
CO2: 25 mmol/L (ref 20–31)
Calcium: 8.8 mg/dL (ref 8.6–10.3)
Creat: 1 mg/dL (ref 0.70–1.18)
GFR, Est African American: 83 mL/min (ref >=60)
GFR, Est Non African American: 72 mL/min (ref >=60)
GLUCOSE: 118 mg/dL — AB (ref 65–99)
POTASSIUM: 4.7 mmol/L (ref 3.5–5.3)
SODIUM: 138 mmol/L (ref 135–146)
Total Bilirubin: 0.9 mg/dL (ref 0.2–1.2)
Total Protein: 6.7 g/dL (ref 6.1–8.1)

## 2015-08-21 LAB — LIPID PANEL
CHOL/HDL RATIO: 2.4 ratio (ref <=5.0)
Cholesterol: 178 mg/dL (ref 125–200)
HDL: 74 mg/dL (ref >=40)
LDL CALC: 93 mg/dL (ref <130)
Triglycerides: 53 mg/dL (ref <150)
VLDL: 11 mg/dL (ref <30)

## 2015-08-21 LAB — POCT URINALYSIS DIP (MANUAL ENTRY)
Bilirubin, UA: NEGATIVE
Blood, UA: NEGATIVE
Glucose, UA: NEGATIVE
Ketones, POC UA: NEGATIVE
LEUKOCYTES UA: NEGATIVE
Nitrite, UA: NEGATIVE
PROTEIN UA: NEGATIVE
SPEC GRAV UA: 1.025
UROBILINOGEN UA: 0.2
pH, UA: 5.5

## 2015-08-21 LAB — TSH: TSH: 1.873 u[IU]/mL (ref 0.350–4.500)

## 2015-08-21 MED ORDER — GABAPENTIN 300 MG PO CAPS: ORAL_CAPSULE | ORAL | Status: DC

## 2015-08-21 MED ORDER — DOXAZOSIN MESYLATE 8 MG PO TABS: ORAL_TABLET | ORAL | Status: DC

## 2015-08-21 MED ORDER — METOPROLOL TARTRATE 100 MG PO TABS
ORAL_TABLET | ORAL | Status: DC
Start: 2015-08-21 — End: 2015-09-26

## 2015-08-21 NOTE — Telephone Encounter (Signed)
Pt advised.

## 2015-08-21 NOTE — Telephone Encounter (Signed)
Called patient to let him know he forgot hemoccult cards/strainer. I called the home number the and spouse answered the phone and message was left with her,

## 2015-08-21 NOTE — Progress Notes (Signed)
By signing my name below, I, Stann Oresung-Kai Tsai, attest that this documentation has been prepared under the direction and in the presence of Lesle ChrisSteven Bobby Ragan, MD. Electronically Signed: Stann Oresung-Kai Tsai, Scribe. 08/21/2015 , 8:55 AM .  Patient was seen in room 22 .  Chief Complaint:  Chief Complaint  Patient presents with  . Annual Exam    AWV-S and patient DOES NOT need refills [facility]    HPI: Collin Rose is a 80 y.o. male who [facility].  He's generally feeling well.   Smoking He denies smoking now.   Weakness He notes that his post-polio muscle weakness just went away. [facility] and they state that it was good. The arterial studies reveal no arterial disease.   BP He's doing well on his BP.   Vision He last saw Dr. Hazle Quantigby back in August, once a year.   Past Medical History  Diagnosis Date  . Hypertension   . Heart murmur    Past Surgical History  Procedure Laterality Date  . Spine surgery     Social History   Social History  . Marital Status: Married    Spouse Name: [name]  . Number of Children: [name]  . Years of Education: [name]   Social History Main Topics  . Smoking status: Former Smoker -- 1.00 packs/day    Types: Cigarettes  . Smokeless tobacco: Not on file  . Alcohol Use: Yes  . Drug Use: Not on file  . Sexual Activity: Not on file   Other Topics Concern  . Not on file   Social History Narrative   Family History  Problem Relation Age of Onset  . Cancer Mother   . Stroke Father    No Known Allergies Prior to Admission medications   Medication Sig Start Date End Date Taking? Authorizing Provider  doxazosin (CARDURA) 8 MG tablet TAKE 1 BY MOUTH AT BEDTIME 07/30/15  Yes Collene GobbleSteven A Chereese Cilento, MD  gabapentin (NEURONTIN) 300 MG capsule TAKE 1 (300 MG TOTAL) BY MOUTH 2 TIMES DAILY 03/24/15  Yes Collene GobbleSteven A Ovadia Lopp, MD  metoprolol (LOPRESSOR) 100 MG tablet TAKE 1 BY MOUTH EVERY MORNING 07/30/15  Yes  Collene GobbleSteven A Drinda Belgard, MD     ROS:  Constitutional: negative for fever, chills, night sweats, weight changes, or fatigue  HEENT: negative for vision changes, hearing loss, congestion, rhinorrhea, ST, epistaxis, or sinus pressure Cardiovascular: negative for chest pain or palpitations Respiratory: negative for hemoptysis, wheezing, shortness of breath, or cough Abdominal: negative for abdominal pain, nausea, vomiting, diarrhea, or constipation Dermatological: negative for rash Neurologic: negative for headache, dizziness, or syncope All other systems reviewed and are otherwise negative with the exception to those above and in the HPI.  PHYSICAL EXAM: Filed Vitals:   08/21/15 0824  BP: 134/71  Pulse: 50  Temp: 97.5 F (36.4 C)  Resp: 16   Body mass index is 32.15 kg/(m^2).   General: Alert, no acute distress HEENT:  Normocephalic, atraumatic, oropharynx patent. Poor dentition, right cerumen Eye: EOMI, PEERLDC Cardiovascular:  Bradycardic, Regular rhythm, no rubs murmurs or gallops.  No Carotid bruits, radial pulse intact. No pedal edema.  Respiratory: mild decreased breath sounds in both bases, Clear to auscultation bilaterally.  No wheezes, rales, or rhonchi.  No cyanosis, no use of accessory musculature Abdominal: No organomegaly, abdomen is soft and non-tender, positive bowel sounds. No masses. Musculoskeletal: Gait intact. No edema, tenderness, extremity stasis changes  bilaterally, scaly rash over medial aspect of right lower leg, Left leg muscle atrophy secondary to polio Skin: No rashes. Neurologic: Facial musculature symmetric. Psychiatric: Patient acts appropriately throughout our interaction.  Lymphatic: No cervical or submandibular lymphadenopathy Genitourinary/Anorectal: No acute findings, small prostate, no rectal masses   LABS:   EKG/XRAY:   Primary read interpreted by Dr. Cleta Alberts at [facility].   ASSESSMENT/PLAN:  Patient looks good today. His medications were refilled. He  is up-to-date on immunizations. Routine labs were drawn today. The problem with swelling he had over the summer has resolved.I personally performed the services described in this documentation, which was scribed in my presence. The recorded information has been reviewed and is accurate.  Gross sideeffects, risk and benefits, and alternatives of medications d/w patient. Patient is aware that all medications have potential sideeffects and we are unable to predict every sideeffect or drug-drug interaction that may occur.  [name] 08/21/2015 8:55 AM

## 2015-08-21 NOTE — Patient Instructions (Signed)

## 2015-08-21 NOTE — Telephone Encounter (Signed)
Pt received a call from us concerning his visit today 12/29 with Dr. Cleta Albertsaub. He is [facility] confused [facility] and letting [facility] know [facility] were [facility]. The phone call his wife answered from [place]thia-I read the message back to him. He doesn't know what any of this is about. Please advise at  # [phone]

## 2015-08-21 NOTE — Telephone Encounter (Signed)
Please call the patient. He does not need to pick up any prescriptions. His medications were refilled during his office visit today but if he has enough of his medicines he does not need to go to the pharmacy and pick any new prescriptions

## 2015-08-22 LAB — PSA, MEDICARE: PSA: 1.02 ng/mL (ref <=4.00)

## 2015-08-24 LAB — VITAMIN B12: Vitamin B-12: 362 pg/mL (ref 211–911)

## 2015-08-29 NOTE — Addendum Note (Signed)
Addended by: Johnnette LitterARDWELL, Gertude Benito M on: 08/29/2015 01:32 PM   Modules accepted: Kipp BroodSmartSet

## 2015-09-25 ENCOUNTER — Telehealth: Payer: Self-pay

## 2015-09-25 DIAGNOSIS — A809 Acute poliomyelitis, unspecified: Secondary | ICD-10-CM

## 2015-09-25 DIAGNOSIS — G63 Polyneuropathy in diseases classified elsewhere: Secondary | ICD-10-CM

## 2015-09-25 DIAGNOSIS — I10 Essential (primary) hypertension: Secondary | ICD-10-CM

## 2015-09-25 NOTE — Telephone Encounter (Signed)
Pt is needing a refill on gabapentin 300 mg he states that this is thru [facility] and it needs to be 90 pills with 3 refills   Best number [phone]

## 2015-09-26 MED ORDER — DOXAZOSIN MESYLATE 8 MG PO TABS: ORAL_TABLET | ORAL | Status: DC

## 2015-09-26 MED ORDER — METOPROLOL TARTRATE 100 MG PO TABS: ORAL_TABLET | ORAL | Status: DC

## 2015-09-26 MED ORDER — GABAPENTIN 300 MG PO CAPS: ORAL_CAPSULE | ORAL | Status: DC

## 2015-09-26 NOTE — Telephone Encounter (Signed)
Sent in what Dr Cleta Alberts had written for at [facility] a month ago and at the time sent locally. Called pt to advise and he stated that he will eventually need the other two Rxs sent to mail order too, but doesn't need them yet. I advised I will send them now w/note to hold until pt needs RF.

## 2015-10-02 ENCOUNTER — Telehealth: Payer: Self-pay

## 2015-10-02 NOTE — Telephone Encounter (Signed)
Patient picked up medication that he does not take.

## 2015-10-03 ENCOUNTER — Telehealth: Payer: Self-pay

## 2015-10-03 NOTE — Telephone Encounter (Signed)
Please get details. 

## 2015-10-03 NOTE — Telephone Encounter (Signed)
PA needed for Potassium Chloride supplement. Completed the one started on covermymeds. Pending.

## 2015-10-04 ENCOUNTER — Telehealth: Payer: Self-pay

## 2015-10-04 NOTE — Telephone Encounter (Incomplete)
Pt has questions about Klor-con the potassium medication that was prescribed to him. Pt has no recollection of this being prescribed by Dr. Cleta Alberts, so he has questions ab otu this

## 2015-10-07 NOTE — Telephone Encounter (Signed)
Unable to reach. Phone does not ring.

## 2015-10-14 NOTE — Telephone Encounter (Signed)
PA approved through 10/02/16. Notified pharm.

## 2015-10-23 DIAGNOSIS — D225 Melanocytic nevi of trunk: Secondary | ICD-10-CM | POA: Diagnosis not present

## 2015-10-23 DIAGNOSIS — L308 Other specified dermatitis: Secondary | ICD-10-CM | POA: Diagnosis not present

## 2015-10-23 DIAGNOSIS — L821 Other seborrheic keratosis: Secondary | ICD-10-CM | POA: Diagnosis not present

## 2015-10-23 DIAGNOSIS — L570 Actinic keratosis: Secondary | ICD-10-CM | POA: Diagnosis not present

## 2015-10-29 DIAGNOSIS — M65341 Trigger finger, right ring finger: Secondary | ICD-10-CM | POA: Diagnosis not present

## 2015-12-02 DIAGNOSIS — L821 Other seborrheic keratosis: Secondary | ICD-10-CM | POA: Diagnosis not present

## 2015-12-02 DIAGNOSIS — D485 Neoplasm of uncertain behavior of skin: Secondary | ICD-10-CM | POA: Diagnosis not present

## 2016-01-15 DIAGNOSIS — S30861A Insect bite (nonvenomous) of abdominal wall, initial encounter: Secondary | ICD-10-CM | POA: Diagnosis not present

## 2016-02-17 ENCOUNTER — Ambulatory Visit: Payer: Medicare Other | Admitting: Emergency Medicine

## 2016-02-19 ENCOUNTER — Encounter: Payer: Self-pay | Admitting: Emergency Medicine

## 2016-02-19 ENCOUNTER — Ambulatory Visit (INDEPENDENT_AMBULATORY_CARE_PROVIDER_SITE_OTHER): Payer: Medicare Other | Admitting: Emergency Medicine

## 2016-02-19 VITALS — BP 130/80 | HR 62 | Temp 97.4°F | Resp 16 | Ht 76.0 in | Wt 266.0 lb

## 2016-02-19 DIAGNOSIS — I10 Essential (primary) hypertension: Secondary | ICD-10-CM | POA: Diagnosis not present

## 2016-02-19 DIAGNOSIS — R601 Generalized edema: Secondary | ICD-10-CM

## 2016-02-19 LAB — CBC WITH DIFFERENTIAL/PLATELET
BASOS PCT: 1 %
Basophils Absolute: 51 cells/uL (ref 0–200)
EOS ABS: 204 {cells}/uL (ref 15–500)
Eosinophils Relative: 4 %
HCT: 42.1 % (ref 38.5–50.0)
Hemoglobin: 14.5 g/dL (ref 13.2–17.1)
LYMPHS PCT: 27 %
Lymphs Abs: 1377 cells/uL (ref 850–3900)
MCH: 34.4 pg — ABNORMAL HIGH (ref 27.0–33.0)
MCHC: 34.4 g/dL (ref 32.0–36.0)
MCV: 100 fL (ref 80.0–100.0)
MPV: 11.1 fL (ref 7.5–12.5)
Monocytes Absolute: 408 cells/uL (ref 200–950)
Monocytes Relative: 8 %
Neutro Abs: 3060 cells/uL (ref 1500–7800)
Neutrophils Relative %: 60 %
PLATELETS: 169 10*3/uL (ref 140–400)
RBC: 4.21 MIL/uL (ref 4.20–5.80)
RDW: 13.4 % (ref 11.0–15.0)
WBC: 5.1 10*3/uL (ref 3.8–10.8)

## 2016-02-19 LAB — BASIC METABOLIC PANEL
BUN: 13 mg/dL (ref 7–25)
CALCIUM: 8.5 mg/dL — AB (ref 8.6–10.3)
CHLORIDE: 103 mmol/L (ref 98–110)
CO2: 27 mmol/L (ref 20–31)
CREATININE: 1.08 mg/dL (ref 0.70–1.18)
Glucose, Bld: 108 mg/dL — ABNORMAL HIGH (ref 65–99)
Potassium: 4.7 mmol/L (ref 3.5–5.3)
Sodium: 137 mmol/L (ref 135–146)

## 2016-02-19 NOTE — Patient Instructions (Signed)
     IF you received an x-ray today, you will receive an invoice from [facility]. Please contact [facility] at [phone] with questions or concerns regarding your invoice.   IF you received labwork today, you will receive an invoice from [facility]. Please contact [facility] at [phone] with questions or concerns regarding your invoice.   Our billing staff will not be able to assist you with questions regarding bills from these companies.  You will be contacted with the lab results as soon as they are available. The fastest way to get your results is to activate your My Chart account. Instructions are located on the last page of this paperwork. If you have not heard from us regarding the results in 2 weeks, please contact this office.      

## 2016-02-19 NOTE — Progress Notes (Signed)
By signing my name below, I, Mesha Guinyard, attest that this documentation has been prepared under the direction and in the presence of Lesle ChrisSteven Kanetra Ho, MD.  Electronically Signed: Arvilla MarketMesha Guinyard, Medical Scribe. 02/19/2016. 9:08 AM.  Chief Complaint:  Chief Complaint  Patient presents with  . Follow-up  . Hypertension    HPI: Collin Rose is a 81 y.o. male with a PMHx of HTN who [facility]. Pt states he's been doing good and feeling good. Pt denies chest pain, chest tightness, and abdominal pain. Pt denies experiencing any new urinary symptoms. Pt reports a tick bite on his upper abdominal, pt got this evaluated before coming in and was given some medication for this. Pt states he doesn't need a refill on his medications. Pt doesn't take a baby ASA. Pt denies a PMHx of heart attack or stroke.   Cardiology: Dr. Jacinto HalimGanji is his cardiologist. Pt had an echocardiogram July 2014 that showed nl ejection fraction and left atrial enlargement  SHx: Pt stopped smoking about 40 years ago.  Past Medical History  Diagnosis Date  . Hypertension   . Heart murmur    Past Surgical History  Procedure Laterality Date  . Spine surgery     Social History   Social History  . Marital Status: Married    Spouse Name: [name]  . Number of Children: [name]  . Years of Education: [name]   Social History Main Topics  . Smoking status: Former Smoker -- 1.00 packs/day    Types: Cigarettes  . Smokeless tobacco: None  . Alcohol Use: Yes  . Drug Use: None  . Sexual Activity: Not Asked   Other Topics Concern  . None   Social History Narrative   Family History  Problem Relation Age of Onset  . Cancer Mother   . Stroke Father    No Known Allergies Prior to Admission medications   Medication Sig Start Date End Date Taking? Authorizing Provider  doxazosin (CARDURA) 8 MG tablet TAKE 1 BY MOUTH AT BEDTIME 09/26/15   Collene GobbleSteven A Kimberli Winne, MD  gabapentin (NEURONTIN) 300 MG capsule TAKE 1 (300 MG  TOTAL) BY MOUTH 2 TIMES DAILY 09/26/15   Collene GobbleSteven A Kinza Gouveia, MD  metoprolol (LOPRESSOR) 100 MG tablet TAKE 1 BY MOUTH EVERY MORNING 09/26/15   Collene GobbleSteven A Koren Plyler, MD     ROS: The patient denies fevers, chills, night sweats, unintentional weight loss, chest pain, palpitations, wheezing, dyspnea on exertion, nausea, vomiting, abdominal pain, dysuria, hematuria, melena, numbness, weakness, or tingling.  All other systems have been reviewed and were otherwise negative with the exception of those mentioned in the HPI and as above.    PHYSICAL EXAM: Filed Vitals:   02/19/16 0858 02/19/16 0904  BP: 150/72 130/80  Pulse: 62   Temp: 97.4 F (36.3 C)   Resp: 16    Body mass index is 32.39 kg/(m^2).  Wt Readings from Last 3 Encounters:  02/19/16 266 lb (120.657 kg)  08/21/15 264 lb (119.75 kg)  04/09/15 261 lb (118.389 kg)   General: Alert, no acute distress HEENT:  Normocephalic, atraumatic, oropharynx patent. Eye: [name], [name] Cardiovascular:  Regular rate and rhythm, no rubs murmurs or gallops.  No Carotid bruits, radial pulse intact. No pedal edema.  Respiratory: Clear to auscultation bilaterally.  No wheezes, rales, or rhonchi.  No cyanosis, no use of accessory musculature Abdominal: No organomegaly, abdomen is soft and non-tender, positive bowel sounds.  No masses. Musculoskeletal: Gait intact. No edema, tenderness Skin:  No rashes. 1x1 cm tick bite on his left upper abdomen. Neurologic: Facial musculature symmetric. Psychiatric: Patient acts appropriately throughout our interaction. Lymphatic: No cervical or submandibular lymphadenopathy   LABS:    EKG/XRAY:   Primary read interpreted [facility].   ASSESSMENT/PLAN: Patient is stable at present. No change in medications. The problem he was having the swelling has resolved. He overall feels well. CBC and basic metabolic panel were done.I personally performed the services described in this documentation, which was scribed in my  presence. The recorded information has been reviewed and is accurate.   Gross sideeffects, risk and benefits, and alternatives of medications d/w patient. Patient is aware that all medications have potential sideeffects and we are unable to predict every sideeffect or drug-drug interaction that may occur.  Lesle ChrisSteven Vina Byrd MD 02/19/2016 9:08 AM

## 2016-03-12 DIAGNOSIS — M65341 Trigger finger, right ring finger: Secondary | ICD-10-CM | POA: Diagnosis not present

## 2016-04-13 ENCOUNTER — Encounter: Payer: Self-pay | Admitting: Behavioral Health

## 2016-04-13 ENCOUNTER — Telehealth: Payer: Self-pay | Admitting: Behavioral Health

## 2016-04-13 NOTE — Telephone Encounter (Signed)
Pre-Visit Call completed with patient and chart updated.   Pre-Visit Info documented in Specialty Comments under Snap[employer]ot.    

## 2016-04-14 ENCOUNTER — Encounter: Payer: Self-pay | Admitting: Family Medicine

## 2016-04-14 ENCOUNTER — Ambulatory Visit (INDEPENDENT_AMBULATORY_CARE_PROVIDER_SITE_OTHER): Payer: Medicare Other | Admitting: Family Medicine

## 2016-04-14 VITALS — BP 138/82 | HR 48 | Temp 97.9°F | Ht 76.0 in | Wt 265.4 lb

## 2016-04-14 DIAGNOSIS — Z7689 Persons encountering health services in other specified circumstances: Secondary | ICD-10-CM

## 2016-04-14 DIAGNOSIS — R001 Bradycardia, unspecified: Secondary | ICD-10-CM | POA: Diagnosis not present

## 2016-04-14 DIAGNOSIS — M6281 Muscle weakness (generalized): Secondary | ICD-10-CM

## 2016-04-14 DIAGNOSIS — L309 Dermatitis, unspecified: Secondary | ICD-10-CM

## 2016-04-14 DIAGNOSIS — B91 Sequelae of poliomyelitis: Secondary | ICD-10-CM

## 2016-04-14 DIAGNOSIS — Z7189 Other specified counseling: Secondary | ICD-10-CM

## 2016-04-14 MED ORDER — TRIAMCINOLONE ACETONIDE 0.1 % EX OINT: 1.0000 "application " | TOPICAL_OINTMENT | Freq: Two times a day (BID) | CUTANEOUS | 0 refills | Status: DC

## 2016-04-14 NOTE — Progress Notes (Addendum)
[facility] [address], [address] [place], [place] [address] [phone] [phone]  Date:  04/14/2016   Name:  Collin FaceBoyd E Tortorelli   DOB:  August 17, 1935   MRN:  [id number]  PCP:  Abbe AmsterdamOPLAND,Lanett Lasorsa, MD    Chief Complaint: Establish Care (Pt here to est care)   History of Present Illness:  Collin Rose is a 81 y.o. very pleasant male patient who presents with the following:  Patient of my former partner Dr. Cleta Albertsaub who plans to retire soon.  He is here today to establish care. History of HTN, post- polio weakness, GERD, neuropathy, and bradycardia.    He has seen cardiology- Dr. Jacinto HalimGanji- for eval in the past but he does not follow-up regulalry. He has been feeling well.  He denies feeling lightheaded or dizzy. He was not really aware of being bradycardic and does not think this has given him any trouble in the past.  No CP or SOB  He also notes a dry, scaly area on the back of the right calf, about quarter sized. He is not sure how long it has been there, has not tried anything on it.  Just mildly itchy  He is from [place]- born here.  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   He did have polio in childhood- he thinks at age [age]. He has some residual weakness but no severe disability   He has 2 step- daughters, no grandchildren however.   He is UTD on his zoster, prevnar, pneumovx and tetanus shots.    Pulse Readings from Last 3 Encounters:  04/14/16 (!) 48  02/19/16 62  08/21/15 (!) 50   BP Readings from Last 3 Encounters:  04/14/16 138/82  02/19/16 130/80  08/21/15 134/71   Pulse on 01/22/15 was also 48 per chart  Patient Active Problem List   Diagnosis Date Noted  . Bradycardia 04/09/2015  . Post-polio muscle weakness 02/14/2014  . Hypertension 12/13/2011  . GERD (gastroesophageal reflux disease) 12/13/2011  . Neuropathy (HCC) 12/13/2011    Past Medical History:  Diagnosis Date  . Colon polyps   . Heart murmur   .  Hypertension     Past Surgical History:  Procedure Laterality Date  . SPINE SURGERY      Social History  Substance Use Topics  . Smoking status: Former Smoker    Packs/day: 1.00    Types: Cigarettes  . Smokeless tobacco: Not on file  . Alcohol use Yes    Family History  Problem Relation Age of Onset  . Cancer Mother   . Stroke Father     No Known Allergies  Medication list has been reviewed and updated.  Current Outpatient Prescriptions on File Prior to Visit  Medication Sig Dispense Refill  . doxazosin (CARDURA) 8 MG tablet TAKE 1 BY MOUTH AT BEDTIME 90 tablet 3  . gabapentin (NEURONTIN) 300 MG capsule TAKE 1 (300 MG TOTAL) BY MOUTH 2 TIMES DAILY 180 capsule 3  . metoprolol (LOPRESSOR) 100 MG tablet TAKE 1 BY MOUTH EVERY MORNING 90 tablet 3   No current facility-administered medications on file prior to visit.     Review of Systems:  As per HPI- otherwise negative.   Physical Examination: Vitals:   04/14/16 0832  BP: 138/82  Pulse: (!) 48  Temp: 97.9 F (36.6 C)   Vitals:   04/14/16 0832  Weight: 265 lb 6.4 oz (120.4 kg)  Height: 6\' 4"  (1.93  m)   Body mass index is 32.31 kg/m. Ideal Body Weight: Weight in (lb) to have BMI = 25: 205  GEN: WDWN, NAD, Non-toxic, A & O x 3 HEENT: Atraumatic, Normocephalic. Neck supple. No masses, No LAD. Ears and Nose: No external deformity. CV: RRR, No M/G/R. No JVD. No thrill. No extra heart sounds. PULM: CTA B, no wheezes, crackles, rhonchi. No retractions. No resp. distress. No accessory muscle use. ABD: S, NT, ND, +BS. No rebound. No HSM. EXTR: No c/c/e NEURO slow gait due to polio  PSYCH: Normally interactive. Conversant. Not depressed or anxious appearing.  Calm demeanor.  Right posterior calf: scaly area with dry and flaky skin, no redness or edema  Assessment and Plan: Bradycardia  Dermatitis - Plan: triamcinolone ointment (KENALOG) 0.1 %, DISCONTINUED: triamcinolone ointment (KENALOG) 0.1 %  Post-polio  limb muscle weakness  Encounter to establish care  Here today to establish care Will have him try kenalog 0.1% on his dermatitis, he will let me know if not helpful Bradycardic, no symptoms of same.  Will touch base with Dr. Cleta Albertsaub about his BP medication.  ?might take him off the BB and use another agent, but want to make sure no reason he needs the BB first  It was very nice to meet you today!  Please try the steroid ointment on you leg spot- use it 1-2x a day for about 2 weeks.  If this does not clear it up please let me know.  Your heart-rate is a bit slow today.  This is not new, and likely will not cause you any problems.  However it could be related to one of your medications- your metoprolol. I will touch base with Dr. Cleta Albertsaub and see if there is any reason we cannot change to a different blood pressure med for you   Take care and please see me for a physical in December  Signed Abbe AmsterdamJessica Evian Derringer, MD  Heard back from Dr. Cleta Albertsaub- he is not aware of any special reason why he is on a BB.  Would be ok to change his medication.  He did have swelling with norvasc so will avoid this.  Recent renal function normal. Will have him decrease his lopressor to 50 mg daily and add 5mg  of lisinopril.  He plans to call me with a BP update (he has a home cuff) in about one week

## 2016-04-14 NOTE — Progress Notes (Signed)
Pre visit review using our clinic review tool, if applicable. No additional management support is needed unless otherwise documented below in the visit note. 

## 2016-04-14 NOTE — Patient Instructions (Signed)
It was very nice to meet you today!  Please try the steroid ointment on you leg spot- use it 1-2x a day for about 2 weeks.  If this does not clear it up please let me know.  Your heart-rate is a bit slow today.  This is not new, and likely will not cause you any problems.  However it could be related to one of your medications- your metoprolol. I will touch base with Dr. Cleta Albertsaub and see if there is any reason we cannot change to a different blood pressure med for you   Take care and please see me for a physical in December

## 2016-04-18 MED ORDER — LISINOPRIL 5 MG PO TABS: 5.0000 mg | ORAL_TABLET | Freq: Every day | ORAL | 1 refills | Status: DC

## 2016-04-18 NOTE — Addendum Note (Signed)
Addended by: Abbe AmsterdamOPLAND, JESSICA C on: 04/18/2016 03:05 PM   Modules accepted: Orders

## 2016-04-28 ENCOUNTER — Telehealth: Payer: Self-pay | Admitting: Family Medicine

## 2016-04-28 NOTE — Telephone Encounter (Signed)
Caller name: Leavy CellaBoyd Relationship to patient: self Can be reached: [phone]  Reason for call: Pt called with update on Lisinopril 5mg . He is supposed to update Dr. Patsy Lageropland on BP & pulse.  158/63 BP, 48 pulse this morning (it has been 1 week of new med).

## 2016-04-28 NOTE — Telephone Encounter (Signed)
We decreased his lopressor from 100 to 50 mg, added 5 of lisinopril. BP is a bit high. He will increase his lisinopril to 10 mg a day and call me in one more week

## 2016-05-04 DIAGNOSIS — H524 Presbyopia: Secondary | ICD-10-CM | POA: Diagnosis not present

## 2016-05-07 ENCOUNTER — Telehealth: Payer: Self-pay | Admitting: Family Medicine

## 2016-05-07 NOTE — Telephone Encounter (Signed)
Relation to BJ:YNWGpt:self Call back number:[phone]  Reason for call:   Patient was advised to inform PCP today (AM) BP pressure reading 164/73 and Pulse 47.

## 2016-05-08 MED ORDER — LISINOPRIL 10 MG PO TABS: 10.0000 mg | ORAL_TABLET | Freq: Every day | ORAL | 1 refills | Status: DC

## 2016-05-08 NOTE — Addendum Note (Signed)
Addended by: Abbe AmsterdamOPLAND, JESSICA C on: 05/08/2016 05:40 PM   Modules accepted: Orders

## 2016-05-08 NOTE — Telephone Encounter (Signed)
Called him- his pulse has come up to the upper 50s.  His BP is still about 160/70.  I'll rx the 10 mg lisinopril - he will take 1 a day- and come to see me in about 1 month.  He states agreement

## 2016-05-11 ENCOUNTER — Telehealth: Payer: Self-pay

## 2016-05-11 NOTE — Telephone Encounter (Signed)
THIS MESSAGE IS TO DR. DAUB: PATIENT'S WIFE (Collin Rose) WOULD LIKE DR. DAUB'S OPINION ON HER HUSBAND'S PULSE ALWAYS BEING AT 47. DR. Cleta AlbertsAUB REFERRED HIM TO DR. Patsy LagerOPLAND TO BECOME HIS PCP. HE SAW HER IN AUGUST. DR. COPLAND SAID IT WAS PROBABLY HIS MEDICATIONS THAT IS CAUSING HIS PULSE TO BE SO LOW. SHE CHANGED SOME OF THEM AND TOLD HIM TO RETURN TO SEE HER AGAIN IN A MONTH. SHE IS VERY CONCERNED THAT IT IS STILL RUNNING AT 47. SHE WOULD JUST LIKE TO TALK WITH DR. DAUB ABOUT IT. BEST PHONE [phone] (CELL) (Collin Rose [place]-WIFE)

## 2016-05-12 ENCOUNTER — Other Ambulatory Visit: Payer: Self-pay | Admitting: Emergency Medicine

## 2016-05-12 DIAGNOSIS — R001 Bradycardia, unspecified: Secondary | ICD-10-CM

## 2016-05-12 NOTE — Telephone Encounter (Signed)
.   Mrs. Samuella Cotarice called me worried about Mr. Prices bradycardia. Apparently he continues to run about 47. He is a regular patient of Dr. Verl DickerGanji's. I went ahead and put in a referral to see him for his opinion. I hope this is all right. If there is a problem don't hesitate to call me on my cell phone [phone]. She says he has been having a lot of fatigue but no syncopal episodes.

## 2016-05-12 NOTE — Progress Notes (Signed)
Called and spoke with wife she is very concerned about bradycardia. He has seen Dr. Jacinto HalimGanji  in the past. Referral made back to Dr. Jacinto HalimGanji for his opinion. I advised them to keep their follow-up with Dr. Patsy Lageropland to manage his medications in general healthy

## 2016-05-12 NOTE — Telephone Encounter (Signed)
Great thanks Brett CanalesSteve!

## 2016-05-26 ENCOUNTER — Ambulatory Visit (INDEPENDENT_AMBULATORY_CARE_PROVIDER_SITE_OTHER): Payer: Medicare Other | Admitting: Family Medicine

## 2016-05-26 DIAGNOSIS — Z23 Encounter for immunization: Secondary | ICD-10-CM | POA: Diagnosis not present

## 2016-05-26 NOTE — Progress Notes (Signed)
Patient presented here today for flu shot only jp/cma

## 2016-06-10 ENCOUNTER — Ambulatory Visit (INDEPENDENT_AMBULATORY_CARE_PROVIDER_SITE_OTHER): Payer: Medicare Other | Admitting: Family Medicine

## 2016-06-10 ENCOUNTER — Encounter: Payer: Self-pay | Admitting: Family Medicine

## 2016-06-10 VITALS — BP 170/57 | HR 88 | Temp 97.7°F | Ht 76.0 in | Wt 264.6 lb

## 2016-06-10 DIAGNOSIS — I10 Essential (primary) hypertension: Secondary | ICD-10-CM | POA: Diagnosis not present

## 2016-06-10 DIAGNOSIS — R001 Bradycardia, unspecified: Secondary | ICD-10-CM

## 2016-06-10 MED ORDER — LISINOPRIL 10 MG PO TABS: 15.0000 mg | ORAL_TABLET | Freq: Every day | ORAL | 3 refills | Status: DC

## 2016-06-10 NOTE — Patient Instructions (Signed)
We are going to increase your lisinopril to 15 mg (1 and a half of the 10 mg pills) each day. Please continue to check your BP at home about 2x a week and give me an update in 3-4 weeks It was good to see you today!

## 2016-06-10 NOTE — Progress Notes (Signed)
Pre visit review using our clinic review tool, if applicable. No additional management support is needed unless otherwise documented below in the visit note. 

## 2016-06-10 NOTE — Progress Notes (Signed)
[facility] [address] [place], [place] [address] [phone] [phone]  Date:  06/10/2016   Name:  Collin Rose   DOB:  [birth year]   MRN:  [id number]  PCP:  Abbe AmsterdamOPLAND,JESSICA, MD    Chief Complaint: Follow-up (Pt here for f/u visit. Started Lisinopril last visit and is tolerating it well. Pt has log of home bp readings. [facility].)   History of Present Illness:  Collin Rose is a 81 y.o. very pleasant male patient who presents with the following:  Seen by myself about 2 months ago- at that visit we stopped his BB due to bradycardia-  Heard back from Dr. Cleta Albertsaub- he is not aware of any special reason why he is on a BB.  Would be ok to change his medication.  He did have swelling with norvasc so will avoid this.  Recent renal function normal. Will have him decrease his lopressor to 50 mg daily and add 5mg  of lisinopril.  He plans to call me with a BP update (he has a home cuff) in about one week  Here today to recheck his progress.  He brings with him a record of recent BP readings-  152/68, 168/66, 150/62, 146/63- since he came off metoprolol totally BP 174/ 70 and 169/67 home readings.  He is feeling well He is taking 10 mg of lisinopril- he is now totally off the metoprolol. He stopped it a few days ago- tapered off of this gradualy.   He is also taking doxazosin  His pulse has come up well, but SBP is now too high  Pulse Readings from Last 3 Encounters:  06/10/16 88  04/14/16 (!) 48  02/19/16 62   BP Readings from Last 3 Encounters:  06/10/16 (!) 170/57  04/14/16 138/82  02/19/16 130/80      Patient Active Problem List   Diagnosis Date Noted  . Bradycardia 04/09/2015  . Post-polio muscle weakness 02/14/2014  . Hypertension 12/13/2011  . GERD (gastroesophageal reflux disease) 12/13/2011  . Neuropathy (HCC) 12/13/2011    Past Medical History:  Diagnosis Date   . Colon polyps   . Heart murmur   . Hypertension     Past Surgical History:  Procedure Laterality Date  . SPINE SURGERY      Social History  Substance Use Topics  . Smoking status: Former Smoker    Packs/day: 1.00    Types: Cigarettes  . Smokeless tobacco: Not on file  . Alcohol use Yes    Family History  Problem Relation Age of Onset  . Cancer Mother   . Stroke Father     No Known Allergies  Medication list has been reviewed and updated.  Current Outpatient Prescriptions on File Prior to Visit  Medication Sig Dispense Refill  . doxazosin (CARDURA) 8 MG tablet TAKE 1 BY MOUTH AT BEDTIME 90 tablet 3  . gabapentin (NEURONTIN) 300 MG capsule TAKE 1 (300 MG TOTAL) BY MOUTH 2 TIMES DAILY 180 capsule 3  . lisinopril (PRINIVIL,ZESTRIL) 10 MG tablet Take 1 tablet (10 mg total) by mouth daily. 30 tablet 1  . triamcinolone ointment (KENALOG) 0.1 % Apply 1 application topically 2 (two) times daily. 30 g 0   No current facility-administered medications on file prior to visit.     Review of Systems:  As per HPI- otherwise negative.  No fever, chills, nausea, vomiting, CP or  SOB   Physical Examination: Vitals:   06/10/16 0908 06/10/16 0909  BP: (!) 172/65 (!) 170/57  Pulse: 88   Temp: 97.7 F (36.5 C)    Vitals:   06/10/16 0908  Weight: 264 lb 9.6 oz (120 kg)  Height: 6\' 4"  (1.93 m)   Body mass index is 32.21 kg/m. Ideal Body Weight: Weight in (lb) to have BMI = 25: 205  GEN: WDWN, NAD, Non-toxic, A & O x 3, large build, looks well HEENT: Atraumatic, Normocephalic. Neck supple. No masses, No LAD. Ears and Nose: No external deformity. CV: RRR, No M/G/R. No JVD. No thrill. No extra heart sounds. PULM: CTA B, no wheezes, crackles, rhonchi. No retractions. No resp. distress. No accessory muscle use. EXTR: No c/c/e NEURO Normal gait.  PSYCH: Normally interactive. Conversant. Not depressed or anxious appearing.  Calm demeanor.    Assessment and Plan: Essential  hypertension - Plan: lisinopril (PRINIVIL,ZESTRIL) 10 MG tablet  Bradycardia  Here today to recheck BP and bradycardia. Bradycardia is resolved with d/c of beta blocker.  However his average BP is a bit high- will increase lisinopril slightly and he will let me know how his BP responds over the next few weeks   Signed Abbe Amsterdam, MD

## 2016-06-16 ENCOUNTER — Telehealth: Payer: Self-pay | Admitting: Family Medicine

## 2016-06-16 NOTE — Telephone Encounter (Signed)
Patient states that the pharmacy has lisinopril (PRINIVIL,ZESTRIL) 10 MG tablet ready to send to him, but he thinks that Dr. Patsy Lageropland wanted a 15mg  tablet. He would like clarification before they send him the medication. Patient would like a call regarding this issue. Please advise.   Phone: [phone] [facility] [place] - [id number]

## 2016-06-16 NOTE — Telephone Encounter (Signed)
Called him to clarify- he will be taking 1.5 to equal 15, this med does not come in a 15 mg tab.

## 2016-08-14 ENCOUNTER — Emergency Department (HOSPITAL_BASED_OUTPATIENT_CLINIC_OR_DEPARTMENT_OTHER)
Admission: EM | Admit: 2016-08-14 | Discharge: 2016-08-14 | Disposition: A | Discharge status: Home / Self Care | Payer: Medicare Other | Attending: Emergency Medicine | Admitting: Emergency Medicine

## 2016-08-14 ENCOUNTER — Encounter (HOSPITAL_BASED_OUTPATIENT_CLINIC_OR_DEPARTMENT_OTHER): Payer: Self-pay | Admitting: Emergency Medicine

## 2016-08-14 DIAGNOSIS — Z79899 Other long term (current) drug therapy: Secondary | ICD-10-CM | POA: Diagnosis not present

## 2016-08-14 DIAGNOSIS — N390 Urinary tract infection, site not specified: Secondary | ICD-10-CM

## 2016-08-14 DIAGNOSIS — R300 Dysuria: Secondary | ICD-10-CM | POA: Diagnosis present

## 2016-08-14 DIAGNOSIS — Z87891 Personal history of nicotine dependence: Secondary | ICD-10-CM | POA: Diagnosis not present

## 2016-08-14 DIAGNOSIS — I10 Essential (primary) hypertension: Secondary | ICD-10-CM | POA: Insufficient documentation

## 2016-08-14 LAB — URINALYSIS, MICROSCOPIC (REFLEX)

## 2016-08-14 LAB — URINALYSIS, ROUTINE W REFLEX MICROSCOPIC
GLUCOSE, UA: NEGATIVE mg/dL
KETONES UR: NEGATIVE mg/dL
Nitrite: NEGATIVE
PH: 5.5 (ref 5.0–8.0)
PROTEIN: NEGATIVE mg/dL
Specific Gravity, Urine: 1.024 (ref 1.005–1.030)

## 2016-08-14 MED ORDER — CIPROFLOXACIN HCL 500 MG PO TABS
500.0000 mg | ORAL_TABLET | Freq: Two times a day (BID) | ORAL | 0 refills | Status: DC
Start: 2016-08-14 — End: 2016-08-30

## 2016-08-14 MED ORDER — CIPROFLOXACIN HCL 500 MG PO TABS
500.0000 mg | ORAL_TABLET | Freq: Once | ORAL | Status: AC
  Administered 2016-08-14: 500 mg via ORAL
  Filled 2016-08-14: qty 1

## 2016-08-14 MED ORDER — FLUCONAZOLE 200 MG PO TABS: 200.0000 mg | ORAL_TABLET | Freq: Every day | ORAL | 0 refills | Status: DC

## 2016-08-14 MED ORDER — PHENAZOPYRIDINE HCL 100 MG PO TABS
200.0000 mg | ORAL_TABLET | Freq: Once | ORAL | Status: AC
  Administered 2016-08-14: 200 mg via ORAL
  Filled 2016-08-14: qty 2

## 2016-08-14 MED ORDER — PHENAZOPYRIDINE HCL 200 MG PO TABS
200.0000 mg | ORAL_TABLET | Freq: Three times a day (TID) | ORAL | 0 refills | Status: DC
Start: 2016-08-14 — End: 2016-08-30

## 2016-08-14 NOTE — ED Provider Notes (Signed)
[facility] Provider Note   CSN: [id number] Arrival date & time: 08/14/16  0945     History   Chief Complaint Chief Complaint  Patient presents with  . Dysuria    HPI Bud FaceBoyd E Wysocki is a 81 y.o. male.  HPI Patient reports he awakened this morning with burning with urination. He states he has had to go to the bathroom multiple times with small dribbling amounts. He denies any suprapubic pressure or feeling of inability to empty his bladder. No flank pain. No fever. No general malaise. Patient ports he has had a urinary tract infection in the past and this feels very similar. Past Medical History:  Diagnosis Date  . Colon polyps   . Heart murmur   . Hypertension     Patient Active Problem List   Diagnosis Date Noted  . Bradycardia 04/09/2015  . Post-polio muscle weakness 02/14/2014  . Hypertension 12/13/2011  . GERD (gastroesophageal reflux disease) 12/13/2011  . Neuropathy (HCC) 12/13/2011    Past Surgical History:  Procedure Laterality Date  . BACK SURGERY    . SPINE SURGERY         Home Medications    Prior to Admission medications   Medication Sig Start Date End Date Taking? Authorizing Provider  doxazosin (CARDURA) 8 MG tablet TAKE 1 BY MOUTH AT BEDTIME 09/26/15  Yes Collene GobbleSteven A Daub, MD  lisinopril (PRINIVIL,ZESTRIL) 10 MG tablet Take 1.5 tablets (15 mg total) by mouth daily. 06/10/16  Yes Gwenlyn FoundJessica C Copland, MD  ciprofloxacin (CIPRO) 500 MG tablet Take 1 tablet (500 mg total) by mouth 2 (two) times daily. One po bid x 7 days 08/14/16   Arby BarretteMarcy April Colter, MD  fluconazole (DIFLUCAN) 200 MG tablet Take 1 tablet (200 mg total) by mouth daily. 08/14/16   Arby BarretteMarcy Makya Phillis, MD  gabapentin (NEURONTIN) 300 MG capsule TAKE 1 (300 MG TOTAL) BY MOUTH 2 TIMES DAILY 09/26/15   Collene GobbleSteven A Daub, MD  phenazopyridine (PYRIDIUM) 200 MG tablet Take 1 tablet (200 mg total) by mouth 3 (three) times daily. 08/14/16   Arby BarretteMarcy Lelia Jons, MD  triamcinolone ointment (KENALOG) 0.1 % Apply 1  application topically 2 (two) times daily. 04/14/16   Pearline CablesJessica C Copland, MD    Family History Family History  Problem Relation Age of Onset  . Cancer Mother   . Stroke Father     Social History Social History  Substance Use Topics  . Smoking status: Former Smoker    Packs/day: 1.00    Types: Cigarettes  . Smokeless tobacco: Never Used  . Alcohol use Yes     Allergies   Patient has no known allergies.   Review of Systems Review of Systems Constitutional: No fever no malaise no myalgia Respiratory: No chest pain no shortness of breath no cough GI: No abdominal pain no nausea no vomiting no diarrhea  Physical Exam Updated Vital Signs BP 178/65 (BP Location: Right Arm)   Pulse 91   Temp 98 F (36.7 C) (Oral)   Resp 18   Ht 6\' 4"  (1.93 m)   Wt 260 lb (117.9 kg)   SpO2 97%   BMI 31.65 kg/m   Physical Exam  Constitutional: He is oriented to person, place, and time. He appears well-developed and well-nourished. No distress.  HENT:  Head: Normocephalic and atraumatic.  Eyes: EOM are normal.  Cardiovascular: Normal rate, regular rhythm and normal heart sounds.   Pulmonary/Chest: Effort normal and breath sounds normal.  Abdominal: Soft. He exhibits no distension. There is no  tenderness. There is no guarding.  Suprapubic region nontender without firmness.  Genitourinary:  Genitourinary Comments: Penis normal appearance. Patient does have foreskin. Retracted foreskin shows no erythema, exudate or swelling of the glans. No scrotal edema.  Musculoskeletal: Normal range of motion.  Patient is up and ambulatory in the emergency department.  Neurological: He is alert and oriented to person, place, and time. He exhibits normal muscle tone. Coordination normal.  Skin: Skin is warm and dry.  Psychiatric: He has a normal mood and affect.     ED Treatments / Results  Labs (all labs ordered are listed, but only abnormal results are displayed) Labs Reviewed  URINALYSIS,  ROUTINE W REFLEX MICROSCOPIC - Abnormal; Notable for the following:       Result Value   Color, Urine AMBER (*)    APPearance CLOUDY (*)    Hgb urine dipstick SMALL (*)    Bilirubin Urine SMALL (*)    Leukocytes, UA MODERATE (*)    All other components within normal limits  URINALYSIS, MICROSCOPIC (REFLEX) - Abnormal; Notable for the following:    Bacteria, UA MANY (*)    Squamous Epithelial / LPF 0-5 (*)    All other components within normal limits  URINE CULTURE    EKG  EKG Interpretation None       Radiology No results found.  Procedures Procedures (including critical care time)  Medications Ordered in ED Medications  phenazopyridine (PYRIDIUM) tablet 200 mg (200 mg Oral Given 08/14/16 1100)  ciprofloxacin (CIPRO) tablet 500 mg (500 mg Oral Given 08/14/16 1100)     Initial Impression / Assessment and Plan / ED Course  I have reviewed the triage vital signs and the nursing notes.  Pertinent labs & imaging results that were available during my care of the patient were reviewed by me and considered in my medical decision making (see chart for details).  Clinical Course     Final Clinical Impressions(s) / ED Diagnoses   Final diagnoses:  Lower urinary tract infectious disease  Patient's symptoms just started this morning. He does not describe symptoms of urinary retention nor does he has suprapubic tenderness or fullness to palpation.UA is grossly positive for white cells too numerous to count. So present is yeast. Physical exam does not show balanitis or evident yeast exudate under the prepuce. Will opt to treat both for bacterial as well asYeast cystitis. Patient does not show evidence of general or systemic illness and symptoms just started today. He is counseled on follow-up with his primary care provider.  New Prescriptions New Prescriptions   CIPROFLOXACIN (CIPRO) 500 MG TABLET    Take 1 tablet (500 mg total) by mouth 2 (two) times daily. One po bid x 7 days    FLUCONAZOLE (DIFLUCAN) 200 MG TABLET    Take 1 tablet (200 mg total) by mouth daily.   PHENAZOPYRIDINE (PYRIDIUM) 200 MG TABLET    Take 1 tablet (200 mg total) by mouth 3 (three) times daily.     Arby BarretteMarcy Azia Toutant, MD 08/14/16 [phone]

## 2016-08-14 NOTE — ED Triage Notes (Signed)
Painful urination, urinary frequency, x 1 day.

## 2016-08-17 LAB — URINE CULTURE: Culture: >=100000 — AB

## 2016-08-18 ENCOUNTER — Encounter: Payer: Self-pay | Admitting: Family Medicine

## 2016-08-18 ENCOUNTER — Ambulatory Visit (INDEPENDENT_AMBULATORY_CARE_PROVIDER_SITE_OTHER): Payer: Medicare Other | Admitting: Family Medicine

## 2016-08-18 VITALS — BP 98/66 | HR 85 | Temp 98.0°F | Ht 76.0 in | Wt 257.6 lb

## 2016-08-18 DIAGNOSIS — R300 Dysuria: Secondary | ICD-10-CM | POA: Diagnosis not present

## 2016-08-18 NOTE — Patient Instructions (Signed)
Keep taking your medicine that you got from the emergency department until you run out.  Someone will call you regarding your urology appointment.

## 2016-08-18 NOTE — Progress Notes (Signed)
Pre visit review using our clinic review tool, if applicable. No additional management support is needed unless otherwise documented below in the visit note. 

## 2016-08-18 NOTE — Progress Notes (Signed)
Chief Complaint  Patient presents with  . Urinary Tract Infection    Pt reports going to ED burning when urinating, LBP , but pt reports no relief/ Pt was dx with UTI     Collin Rose is a 81 y.o. male here for possible UTI.  Duration: 4 days. Symptoms: urinary frequency and burning Denies: hematuria, fevers, DC, testicular pain, skin lesions Seen in ED and tx'd for UTI with Cipro, also tx'd for fungal infection with Fluconazole.  He is not circumcised.  Hx of recurrent UTI? No Denies new sexual partners.   ROS:  Constitutional: denies fever GU: As noted in HPI MSK: Denies back pain Abd: Denies constipation or abdominal pain  Past Medical History:  Diagnosis Date  . Colon polyps   . Heart murmur   . Hypertension    Family History  Problem Relation Age of Onset  . Cancer Mother   . Stroke Father    Social History   Social History  . Marital status: Married   Social History Main Topics  . Smoking status: Former Smoker    Packs/day: 1.00    Types: Cigarettes  . Smokeless tobacco: Never Used  . Alcohol use Yes  . Drug use: Unknown   BP 98/66 (BP Location: Right Arm, Patient Position: Sitting, Cuff Size: Small)   Pulse 85   Temp 98 F (36.7 C) (Oral)   Ht 6\' 4"  (1.93 m)   Wt 257 lb 9.6 oz (116.8 kg)   SpO2 96%   BMI 31.36 kg/m  General: Awake, alert, appears stated age Heart: RRR, no murmurs Lungs: CTAB, normal respiratory effort, no accessory muscle usage Abd: BS+, soft, NT, ND, no masses or organomegaly Rectal: Normal external sphinchter tone. Prostate is not enlarged, without nodules and without notable bogginess or TTP. MSK: No CVA tenderness, neg Lloyd's sign Psych: Age appropriate judgment and insight  Dysuria - Plan: Ambulatory referral to Urology  Refer to Urology for 2nd opinion regarding non-resolving dysuria.  Culture MIC shows sensitivity to Cipro, stay on this and Diflucan until completion of courses. F/u prn. The patient voiced  understanding and agreement to the plan.  Jilda Rocheicholas Paul LocklandWendling, DO 08/18/16 9:42 AM

## 2016-08-26 ENCOUNTER — Telehealth: Payer: Self-pay | Admitting: Family Medicine

## 2016-08-26 NOTE — Telephone Encounter (Signed)
Relation to WU:JWJXpt:self Call back number:[phone] Pharmacy:  Reason for call:  Patient states he's experiencing frequent urination, urologist appointment is scheduled for 09/28/16 (date was the soonest availability) patient scheduled a follow up with PCP Monday 08/30/16, patient in need of clinical advice

## 2016-08-26 NOTE — Telephone Encounter (Signed)
I have spoken to patient and he explained that he had more medication to take for issue so he has scheduled an appointment to be seen Monday. I informed patient if symtpoms worsen please give us a call. TL/CMA

## 2016-08-30 ENCOUNTER — Ambulatory Visit (INDEPENDENT_AMBULATORY_CARE_PROVIDER_SITE_OTHER): Payer: Medicare Other | Admitting: Family Medicine

## 2016-08-30 ENCOUNTER — Encounter: Payer: Self-pay | Admitting: Family Medicine

## 2016-08-30 VITALS — BP 110/72 | HR 85 | Temp 98.0°F | Ht 76.0 in | Wt 256.4 lb

## 2016-08-30 DIAGNOSIS — R7309 Other abnormal glucose: Secondary | ICD-10-CM | POA: Diagnosis not present

## 2016-08-30 DIAGNOSIS — R800 Isolated proteinuria: Secondary | ICD-10-CM

## 2016-08-30 DIAGNOSIS — R350 Frequency of micturition: Secondary | ICD-10-CM | POA: Diagnosis not present

## 2016-08-30 LAB — POC URINALSYSI DIPSTICK (AUTOMATED)
Bilirubin, UA: NEGATIVE
Blood, UA: NEGATIVE
Glucose, UA: NEGATIVE
Ketones, UA: NEGATIVE
LEUKOCYTES UA: NEGATIVE
Nitrite, UA: NEGATIVE
PH UA: 6
Spec Grav, UA: >=1.03
Urobilinogen, UA: NEGATIVE

## 2016-08-30 LAB — BASIC METABOLIC PANEL
BUN: 15 mg/dL (ref 6–23)
CALCIUM: 8.9 mg/dL (ref 8.4–10.5)
CHLORIDE: 105 meq/L (ref 96–112)
CO2: 29 meq/L (ref 19–32)
CREATININE: 1.06 mg/dL (ref 0.40–1.50)
GFR: 71.59 mL/min (ref >60.00)
Glucose, Bld: 109 mg/dL — ABNORMAL HIGH (ref 70–99)
Potassium: 4.5 mEq/L (ref 3.5–5.1)
SODIUM: 139 meq/L (ref 135–145)

## 2016-08-30 LAB — HEMOGLOBIN A1C: HEMOGLOBIN A1C: 5.9 % (ref 4.6–6.5)

## 2016-08-30 NOTE — Progress Notes (Signed)
Pre visit review using our clinic review tool, if applicable. No additional management support is needed unless otherwise documented below in the visit note. 

## 2016-08-30 NOTE — Progress Notes (Signed)
[facility] at [facility] [address], [address] [place], [place] [address] [phone]  Date:  08/30/2016   Name:  Collin Rose   DOB:  03/20/1935   MRN:  [id number]  PCP:  Abbe Amsterdam, MD    Chief Complaint: Urinary Tract Infection (c/o poss UTI. Pt has increased urinary frequency. Pt was seen in the ER on 12/23 for UTI and has completed antibiotics. Pt does have appt with urologist in Feb.  Pt due for AWV. )   History of Present Illness:  Collin Rose is a 82 y.o. very pleasant male patient who presents with the following:  On 12/23 seen in ER with UTI sx- urine culture showed klebsiella sensitive to most, he was treated with cipro which was effective against organism. Also given diflucan for balanitis. He came back to office on 12/27 with some symptoms still, saw my partner Dr. Carmelia Roller. He was referred to urology- appt next month.  Here today with complaint of persistent frequent urination- however this does seem to be getting better over the last 24 hours.   He otherwise states that he is feeling well  No dysuria, no hematuria. No belly pain, back pain, fever or chills His appetite is normal He does think he had a UTI several years ago dx at [facility] but I think this was before they started on EMR so I cannot view this visit or see any prior urine culture No history of nephrolithiasis   No penile swelling, itching or discharge.   He has small protein in his urine today- noted a few borderline glucose readings in the past.  Will check an A1c  Lab Results  Component Value Date   HGBA1C 5.4 02/02/2012    Patient Active Problem List   Diagnosis Date Noted  . Bradycardia 04/09/2015  . Post-polio muscle weakness 02/14/2014  . Hypertension 12/13/2011  . GERD (gastroesophageal reflux disease) 12/13/2011  . Neuropathy (HCC) 12/13/2011    Past Medical History:  Diagnosis Date  . Colon polyps   . Heart murmur   . Hypertension      Past Surgical History:  Procedure Laterality Date  . BACK SURGERY    . SPINE SURGERY      Social History  Substance Use Topics  . Smoking status: Former Smoker    Packs/day: 1.00    Types: Cigarettes  . Smokeless tobacco: Never Used  . Alcohol use Yes    Family History  Problem Relation Age of Onset  . Cancer Mother   . Stroke Father     No Known Allergies  Medication list has been reviewed and updated.  Current Outpatient Prescriptions on File Prior to Visit  Medication Sig Dispense Refill  . doxazosin (CARDURA) 8 MG tablet TAKE 1 BY MOUTH AT BEDTIME 90 tablet 3  . gabapentin (NEURONTIN) 300 MG capsule TAKE 1 (300 MG TOTAL) BY MOUTH 2 TIMES DAILY 180 capsule 3  . lisinopril (PRINIVIL,ZESTRIL) 10 MG tablet Take 1.5 tablets (15 mg total) by mouth daily. 135 tablet 3   No current facility-administered medications on file prior to visit.     Review of Systems:  As per HPI- otherwise negative. No cough, ST, fever, chills, nausea, vomiting, chest pain or SOB   Physical Examination: Vitals:   08/30/16 1309  BP: 110/72  Pulse: 85  Temp: 98 F (36.7 C)   Vitals:   08/30/16 1309  Weight: 256 lb 6.4 oz (116.3 kg)  Height: 6'  4" (1.93 m)   Body mass index is 31.21 kg/m. Ideal Body Weight: Weight in (lb) to have BMI = 25: 205  GEN: WDWN, NAD, Non-toxic, A & O x 3, tall build, central obesity.  Looks well HEENT: Atraumatic, Normocephalic. Neck supple. No masses, No LAD. Ears and Nose: No external deformity. CV: RRR, No M/G/R. No JVD. No thrill. No extra heart sounds. PULM: CTA B, no wheezes, crackles, rhonchi. No retractions. No resp. distress. No accessory muscle use. ABD: S, NT, ND. No rebound. No HSM.  Belly is benign, no CVA tenderness  EXTR: No c/c/e NEURO Normal gait for pt- he has mild post polio sx PSYCH: Normally interactive. Conversant. Not depressed or anxious appearing.  Calm demeanor.   Results for orders placed or performed in visit on 08/30/16   POCT Urinalysis Dipstick (Automated)  Result Value Ref Range   Color, UA Yellow    Clarity, UA Clear    Glucose, UA Negative    Bilirubin, UA Negative    Ketones, UA Negative    Spec Grav, UA >=1.030    Blood, UA Negative    pH, UA 6.0    Protein, UA 1+    Urobilinogen, UA negative    Nitrite, UA Negative    Leukocytes, UA Negative Negative     Assessment and Plan: Increased urinary frequency - Plan: POCT Urinalysis Dipstick (Automated), Urine Culture  Isolated proteinuria without specific morphologic lesion - Plan: Basic metabolic panel  Elevated glucose - Plan: Hemoglobin A1C  Here today with persistent but improving urinary frequency. He does have protein on his UA today Will re-culture urine to ensure infection is cleared Urology appt pending BMP to eval renal function a1c to screen for DM Will plan further follow- up pending labs.- he is asked to let me know if any problems come up in the meantime  Signed Abbe AmsterdamJessica Copland, MD

## 2016-08-30 NOTE — Patient Instructions (Signed)
It was very nice to see you today- I am going to check your kidney function and we will sent your urine for a culture to make sure the infection is gone.  Let me know if any questions or problems in the meantime !

## 2016-08-31 LAB — URINE CULTURE: Organism ID, Bacteria: NO GROWTH

## 2016-09-01 ENCOUNTER — Encounter: Payer: Self-pay | Admitting: Family Medicine

## 2016-09-01 DIAGNOSIS — R7303 Prediabetes: Secondary | ICD-10-CM | POA: Insufficient documentation

## 2016-10-18 ENCOUNTER — Other Ambulatory Visit: Payer: Self-pay | Admitting: Emergency Medicine

## 2016-10-18 ENCOUNTER — Telehealth: Payer: Self-pay | Admitting: Family Medicine

## 2016-10-18 DIAGNOSIS — I10 Essential (primary) hypertension: Secondary | ICD-10-CM

## 2016-10-18 MED ORDER — DOXAZOSIN MESYLATE 8 MG PO TABS: ORAL_TABLET | ORAL | 3 refills | Status: DC

## 2016-10-18 NOTE — Telephone Encounter (Signed)
done

## 2016-10-18 NOTE — Telephone Encounter (Signed)
Caller name: Relationship to patient: Self Can be reached: [phone]  Pharmacy:  [facility] [place] [place] [id number] [phone] (Phone) [phone] (Fax)     Reason for call: Refill doxazosin (CARDURA) 8 MG tablet

## 2016-12-02 ENCOUNTER — Telehealth: Payer: Self-pay | Admitting: *Deleted

## 2016-12-02 NOTE — Telephone Encounter (Signed)
Line busy at time of call. Will call back at another time to schedule AWV.

## 2016-12-03 NOTE — Telephone Encounter (Signed)
Patient not available at time of call.

## 2016-12-06 ENCOUNTER — Ambulatory Visit (INDEPENDENT_AMBULATORY_CARE_PROVIDER_SITE_OTHER): Payer: Medicare Other | Admitting: Family Medicine

## 2016-12-06 VITALS — BP 140/63 | HR 63 | Temp 97.8°F | Ht 76.0 in | Wt 239.6 lb

## 2016-12-06 DIAGNOSIS — Z125 Encounter for screening for malignant neoplasm of prostate: Secondary | ICD-10-CM | POA: Diagnosis not present

## 2016-12-06 DIAGNOSIS — Z0000 Encounter for general adult medical examination without abnormal findings: Secondary | ICD-10-CM | POA: Diagnosis not present

## 2016-12-06 DIAGNOSIS — R7309 Other abnormal glucose: Secondary | ICD-10-CM

## 2016-12-06 DIAGNOSIS — M6281 Muscle weakness (generalized): Secondary | ICD-10-CM | POA: Diagnosis not present

## 2016-12-06 DIAGNOSIS — E785 Hyperlipidemia, unspecified: Secondary | ICD-10-CM

## 2016-12-06 DIAGNOSIS — D539 Nutritional anemia, unspecified: Secondary | ICD-10-CM | POA: Diagnosis not present

## 2016-12-06 DIAGNOSIS — B91 Sequelae of poliomyelitis: Secondary | ICD-10-CM

## 2016-12-06 DIAGNOSIS — Z1211 Encounter for screening for malignant neoplasm of colon: Secondary | ICD-10-CM

## 2016-12-06 DIAGNOSIS — R972 Elevated prostate specific antigen [PSA]: Secondary | ICD-10-CM

## 2016-12-06 LAB — CBC
HEMATOCRIT: 41.6 % (ref 39.0–52.0)
Hemoglobin: 14 g/dL (ref 13.0–17.0)
MCHC: 33.6 g/dL (ref 30.0–36.0)
MCV: 99.3 fl (ref 78.0–100.0)
PLATELETS: 149 10*3/uL — AB (ref 150.0–400.0)
RBC: 4.19 Mil/uL — AB (ref 4.22–5.81)
RDW: 13.5 % (ref 11.5–15.5)
WBC: 4.5 10*3/uL (ref 4.0–10.5)

## 2016-12-06 LAB — LIPID PANEL
CHOLESTEROL: 155 mg/dL (ref 0–200)
HDL: 42.3 mg/dL (ref >39.00)
LDL Cholesterol: 101 mg/dL — ABNORMAL HIGH (ref 0–99)
NonHDL: 113.09
Total CHOL/HDL Ratio: 4
Triglycerides: 62 mg/dL (ref 0.0–149.0)
VLDL: 12.4 mg/dL (ref 0.0–40.0)

## 2016-12-06 LAB — COMPREHENSIVE METABOLIC PANEL
ALBUMIN: 4.1 g/dL (ref 3.5–5.2)
ALK PHOS: 103 U/L (ref 39–117)
ALT: 15 U/L (ref 0–53)
AST: 18 U/L (ref 0–37)
BUN: 15 mg/dL (ref 6–23)
CALCIUM: 9.1 mg/dL (ref 8.4–10.5)
CO2: 27 mEq/L (ref 19–32)
CREATININE: 0.93 mg/dL (ref 0.40–1.50)
Chloride: 106 mEq/L (ref 96–112)
GFR: 83.2 mL/min (ref >60.00)
Glucose, Bld: 103 mg/dL — ABNORMAL HIGH (ref 70–99)
POTASSIUM: 4.2 meq/L (ref 3.5–5.1)
Sodium: 140 mEq/L (ref 135–145)
TOTAL PROTEIN: 6.7 g/dL (ref 6.0–8.3)
Total Bilirubin: 1.1 mg/dL (ref 0.2–1.2)

## 2016-12-06 LAB — HEMOGLOBIN A1C: HEMOGLOBIN A1C: 5.4 % (ref 4.6–6.5)

## 2016-12-06 LAB — PSA: PSA: 3.71 ng/mL (ref 0.10–4.00)

## 2016-12-06 NOTE — Patient Instructions (Signed)
It was very nice to see you today!  Great job with weight loss.   We ordered Cologuard testing for you today to screen for colon cancer. However please do give you insurance company a call to ask about your coverage for this test.  If you decide you would rather have a colonoscopy instead let me know  Also, you might ask your insurance company about the Shingrix shingles vaccine. If you like we can also give you this vaccine at your convenience I will be in touch with your labs asap

## 2016-12-06 NOTE — Progress Notes (Addendum)
[facility] at [facility] [address] [place], [place] [address] [phone]  Date:  12/06/2016   Name:  Collin Rose   DOB:  01-24-1935   MRN:  [id number]  PCP:  Abbe Amsterdam, MD    Chief Complaint: Annual Exam (Pt here for CPE and is fasting for labs. )   History of Present Illness:  Collin Rose is a 82 y.o. very pleasant male patient who presents with the following:  History of HTN, GERD, pre-diabetes, post- polio syndrome.  Here today seeking a CPE Due for labs although he did have a BMP and A1c in January  He does get PSA testing - will order for him today Based on my advice he has worked on losing some weight as below- he has lost about 17 lbs  He has cut down on what he was eating eating and has increased his exercise- he is walking 20- 30 minutes He is fasting today for his labs   He is feeling well overall, but does notice some constipation at times  He does not have any CP or SOB when he exercises He has had right knee surgery and this knee hurts sometime, but otherwise his exercise tolerance is good No recent GERD sx- as long as he watches his diet this does not trouble him   Appetite is good He does see dermatology about skin lesion on his face - has regular skin checks BP Readings from Last 3 Encounters:  12/06/16 140/63  08/30/16 110/72  08/18/16 98/66   Wt Readings from Last 3 Encounters:  12/06/16 239 lb 9.6 oz (108.7 kg)  08/30/16 256 lb 6.4 oz (116.3 kg)  08/18/16 257 lb 9.6 oz (116.8 kg)   He saw Dr. Bosie Clos and had a colonoscopy in 2012- he was asked to return in 5 years.  The thought of a colonoscopy at his age is somewhat daunting. However he wonders if he might do cologuard instead.    Will order this for him today   Patient Active Problem List   Diagnosis Date Noted  . Pre-diabetes 09/01/2016  . Bradycardia 04/09/2015  . Post-polio muscle weakness 02/14/2014  . Hypertension 12/13/2011   . GERD (gastroesophageal reflux disease) 12/13/2011  . Neuropathy 12/13/2011    Past Medical History:  Diagnosis Date  . Colon polyps   . Heart murmur   . Hypertension     Past Surgical History:  Procedure Laterality Date  . BACK SURGERY    . SPINE SURGERY      Social History  Substance Use Topics  . Smoking status: Former Smoker    Packs/day: 1.00    Types: Cigarettes  . Smokeless tobacco: Never Used  . Alcohol use Yes    Family History  Problem Relation Age of Onset  . Cancer Mother   . Stroke Father     No Known Allergies  Medication list has been reviewed and updated.  Current Outpatient Prescriptions on File Prior to Visit  Medication Sig Dispense Refill  . doxazosin (CARDURA) 8 MG tablet TAKE 1 BY MOUTH AT BEDTIME 90 tablet 3  . lisinopril (PRINIVIL,ZESTRIL) 10 MG tablet Take 1.5 tablets (15 mg total) by mouth daily. 135 tablet 3   No current facility-administered medications on file prior to visit.     Review of Systems:  As per HPI- otherwise negative.   Physical Examination: Vitals:   12/06/16 0939  BP: 140/63  Pulse: 63  Temp:  97.8 F (36.6 C)   Vitals:   12/06/16 0939  Weight: 239 lb 9.6 oz (108.7 kg)  Height:  (1.93 m)   Body mass index is 29.17 kg/m. Ideal Body Weight: Weight in (lb) to have BMI = 25: 205  GEN: WDWN, NAD, Non-toxic, A & O x 3, tall build, looks well HEENT: Atraumatic, Normocephalic. Neck supple. No masses, No LAD.  Bilateral TM wnl, oropharynx normal.  PEERL,EOMI.   Ears and Nose: No external deformity. CV: RRR, No M/G/R. No JVD. No thrill. No extra heart sounds. PULM: CTA B, no wheezes, crackles, rhonchi. No retractions. No resp. distress. No accessory muscle use. ABD: S, NT, ND, +BS. No rebound. No HSM. EXTR: No c/c/e NEURO Normal gait for pt- mild post polio sx PSYCH: Normally interactive. Conversant. Not depressed or anxious appearing.  Calm demeanor.    Assessment and Plan: Annual physical  exam  Elevated glucose - Plan: Comprehensive metabolic panel, Hemoglobin A1c  Post-polio limb muscle weakness  Hyperlipidemia, unspecified hyperlipidemia type - Plan: Lipid panel  Macrocytic anemia - Plan: CBC  Prostate cancer screening - Plan: PSA  Colon cancer screening  Here today for a CPE cologuard ordered today Will plan further follow- up pending labs. See patient instructions for more details.     Signed Abbe Amsterdam, MD  Received labs 4/17.  His PSA has gone up. Will ask him to come in for a repeat lab in 1 month and will refer to urology if still elevated.     Results for orders placed or performed in visit on 12/06/16  CBC  Result Value Ref Range   WBC 4.5 4.0 - 10.5 K/uL   RBC 4.19 (L) 4.22 - 5.81 Mil/uL   Platelets 149.0 (L) 150.0 - 400.0 K/uL   Hemoglobin 14.0 13.0 - 17.0 g/dL   HCT 16.1 09.6 - 04.5 %   MCV 99.3 78.0 - 100.0 fl   MCHC 33.6 30.0 - 36.0 g/dL   RDW 40.9 81.1 - 91.4 %  Comprehensive metabolic panel  Result Value Ref Range   Sodium 140 135 - 145 mEq/L   Potassium 4.2 3.5 - 5.1 mEq/L   Chloride 106 96 - 112 mEq/L   CO2 27 19 - 32 mEq/L   Glucose, Bld 103 (H) 70 - 99 mg/dL   BUN 15 6 - 23 mg/dL   Creatinine, Ser 7.82 0.40 - 1.50 mg/dL   Total Bilirubin 1.1 0.2 - 1.2 mg/dL   Alkaline Phosphatase 103 39 - 117 U/L   AST 18 0 - 37 U/L   ALT 15 0 - 53 U/L   Total Protein 6.7 6.0 - 8.3 g/dL   Albumin 4.1 3.5 - 5.2 g/dL   Calcium 9.1 8.4 - 95.6 mg/dL   GFR 21.30 >86.57 mL/min  Hemoglobin A1c  Result Value Ref Range   Hgb A1c MFr Bld 5.4 4.6 - 6.5 %  Lipid panel  Result Value Ref Range   Cholesterol 155 0 - 200 mg/dL   Triglycerides 84.6 0.0 - 149.0 mg/dL   HDL 96.29 >52.84 mg/dL   VLDL 13.2 0.0 - 44.0 mg/dL   LDL Cholesterol 102 (H) 0 - 99 mg/dL   Total CHOL/HDL Ratio 4    NonHDL 113.09   PSA  Result Value Ref Range   PSA 3.71 0.10 - 4.00 ng/mL

## 2016-12-07 NOTE — Addendum Note (Signed)
Addended by: Abbe Amsterdam C on: 12/07/2016 08:13 AM   Modules accepted: Orders

## 2016-12-14 DIAGNOSIS — Z86010 Personal history of colonic polyps: Secondary | ICD-10-CM | POA: Diagnosis not present

## 2016-12-20 DIAGNOSIS — K640 First degree hemorrhoids: Secondary | ICD-10-CM | POA: Diagnosis not present

## 2016-12-20 DIAGNOSIS — Z86010 Personal history of colonic polyps: Secondary | ICD-10-CM | POA: Diagnosis not present

## 2016-12-20 DIAGNOSIS — K5730 Diverticulosis of large intestine without perforation or abscess without bleeding: Secondary | ICD-10-CM | POA: Diagnosis not present

## 2016-12-20 DIAGNOSIS — D126 Benign neoplasm of colon, unspecified: Secondary | ICD-10-CM | POA: Diagnosis not present

## 2016-12-23 DIAGNOSIS — D126 Benign neoplasm of colon, unspecified: Secondary | ICD-10-CM | POA: Diagnosis not present

## 2017-01-10 ENCOUNTER — Ambulatory Visit (INDEPENDENT_AMBULATORY_CARE_PROVIDER_SITE_OTHER): Payer: Medicare Other | Admitting: Family Medicine

## 2017-01-10 ENCOUNTER — Ambulatory Visit (HOSPITAL_BASED_OUTPATIENT_CLINIC_OR_DEPARTMENT_OTHER)
Admission: RE | Admit: 2017-01-10 | Discharge: 2017-01-10 | Disposition: A | Discharge status: Home / Self Care | Payer: Medicare Other | Source: Ambulatory Visit | Attending: Family Medicine | Admitting: Family Medicine

## 2017-01-10 VITALS — BP 140/74 | HR 59 | Temp 98.2°F | Ht 76.0 in | Wt 237.0 lb

## 2017-01-10 DIAGNOSIS — R079 Chest pain, unspecified: Secondary | ICD-10-CM | POA: Diagnosis not present

## 2017-01-10 DIAGNOSIS — I517 Cardiomegaly: Secondary | ICD-10-CM | POA: Insufficient documentation

## 2017-01-10 DIAGNOSIS — M25512 Pain in left shoulder: Secondary | ICD-10-CM

## 2017-01-10 DIAGNOSIS — I7090 Unspecified atherosclerosis: Secondary | ICD-10-CM | POA: Insufficient documentation

## 2017-01-10 DIAGNOSIS — R972 Elevated prostate specific antigen [PSA]: Secondary | ICD-10-CM | POA: Diagnosis not present

## 2017-01-10 DIAGNOSIS — M19012 Primary osteoarthritis, left shoulder: Secondary | ICD-10-CM | POA: Insufficient documentation

## 2017-01-10 LAB — TROPONIN I: TNIDX: 0 ug/l (ref 0.00–0.06)

## 2017-01-10 LAB — PSA, MEDICARE: PSA: 2.79 ng/ml (ref 0.10–4.00)

## 2017-01-10 MED ORDER — TRAMADOL HCL 50 MG PO TABS: 50.0000 mg | ORAL_TABLET | Freq: Three times a day (TID) | ORAL | 0 refills | Status: DC | PRN

## 2017-01-10 NOTE — Patient Instructions (Signed)
It was good to see you today!  We will check your prostate labs today and I will let you know how it looks Your chest x-ray looks fine today I am going to do a blood test to help make sure your heart is ok.  Assuming this test is ok we will do a treadmill test for you in the next few weeks.  I think that your shoulder pain is due to injury from using the hedge clippers!  Try the tramadol as needed for pain but remember it can make you a bit sleepy - do not use it if you need to drive

## 2017-01-10 NOTE — Progress Notes (Signed)
[facility] [address], [address] [place], [place] [address] [phone]  Date:  01/10/2017   Name:  Collin Rose   DOB:  24-Jul-1935   MRN:  [id number]  PCP:  Pearline Cables, MD    Chief Complaint: Shoulder Pain (c/o left shoulder pain x 2-3 week. Pt states that he noticed the pain after doing some yardwork. )   History of Present Illness:  Collin Rose is a 82 y.o. very pleasant male patient who presents with the following:  History of pre-diabetes, GERD, post- polio syndrome, controlled HTN.  I saw him for a physical about 6 weeks ago at which time he was doing well.  Labs looked good except PSA had gone up slightly - will repeat for him today   About 3 weeks ago he began to notice a problem with his left shoulder- started after he did some yard work as below  He was using a large shears type hedge clippers to trim bushes.  He did quite a bit of cutting and had to stop and rest a few times.   A couple of days later her noted pain in his left shoulder.  It just seems to ache and will hurt all the way to his elbow at times.  He cannot really describe how long the pain lasts, but it will occur multiple times a day He does not have pain at night.  When he lies flat on his back her feels ok However if he moves his arm a certain way it will hurt but he really cannot reproduce this pain on command for me He does not have any pain with exertion  He just had a colonoscopy on 4/30, so he could only use tylenol for 2 weeks following this. He then tried some ibuprofen for his shoulder but it is not really helping.  He is not taking anything for his shoulder at this time as nothing seemed to help  Never had any left shoulder problem in the past.   He is otherwise feeling well No history of CAD   we also will follow-up on his increased PSA from last labs today Patient Active Problem List   Diagnosis Date Noted  . Pre-diabetes  09/01/2016  . Bradycardia 04/09/2015  . Post-polio muscle weakness 02/14/2014  . Hypertension 12/13/2011  . GERD (gastroesophageal reflux disease) 12/13/2011  . Neuropathy 12/13/2011    Past Medical History:  Diagnosis Date  . Colon polyps   . Heart murmur   . Hypertension     Past Surgical History:  Procedure Laterality Date  . BACK SURGERY    . SPINE SURGERY      Social History  Substance Use Topics  . Smoking status: Former Smoker    Packs/day: 1.00    Types: Cigarettes  . Smokeless tobacco: Never Used  . Alcohol use Yes    Family History  Problem Relation Age of Onset  . Cancer Mother   . Stroke Father     No Known Allergies  Medication list has been reviewed and updated.  Current Outpatient Prescriptions on File Prior to Visit  Medication Sig Dispense Refill  . doxazosin (CARDURA) 8 MG tablet TAKE 1 BY MOUTH AT BEDTIME 90 tablet 3  . lisinopril (PRINIVIL,ZESTRIL) 10 MG tablet Take 1.5 tablets (15 mg total) by mouth daily. 135 tablet 3   No current facility-administered medications on file prior to visit.  Review of Systems:  As per HPI- otherwise negative.   Physical Examination: Vitals:   01/10/17 1303  BP: 140/74  Pulse: (!) 59  Temp: 98.2 F (36.8 C)   Vitals:   01/10/17 1303  Weight: 237 lb (107.5 kg)  Height: 6\' 4"  (1.93 m)   Body mass index is 28.85 kg/m. Ideal Body Weight: Weight in (lb) to have BMI = 25: 205  GEN: WDWN, NAD, Non-toxic, A & O x 3, tall build, looks well HEENT: Atraumatic, Normocephalic. Neck supple. No masses, No LAD.  Bilateral TM wnl, oropharynx normal.  PEERL,EOMI.   Ears and Nose: No external deformity. CV: RRR, No M/G/R. No JVD. No thrill. No extra heart sounds. PULM: CTA B, no wheezes, crackles, rhonchi. No retractions. No resp. distress. No accessory muscle use. ABD: S, NT, ND, +BS. No rebound. No HSM. EXTR: No c/c/e NEURO Normal gait.  PSYCH: Normally interactive. Conversant. Not depressed or anxious  appearing.  Calm demeanor.  He indicates the pain in his "shoulder"- points to both the left anterior shoulder and the area over the left pectoralis muscle. I cannot reproduce his pain with manipulation or ROM of the shoulder in any direction, or with impingement testing No redness, heat or skin lesion noted on shoulder  Dg Chest 2 View  Result Date: 01/10/2017 CLINICAL DATA:  Chest pain.  No known injury . EXAM: CHEST  2 VIEW COMPARISON:  10/30/2014. FINDINGS: Mediastinum and hilar structures normal. Lungs are clear. No pleural effusion or pneumothorax. Cardiomegaly with normal pulmonary vascularity. Degenerative changes thoracic spine . IMPRESSION: 1.  Stable mild cardiomegaly.  No pulmonary venous congestion. 2. No acute pulmonary disease. Electronically Signed   By: Maisie Fus  Register   On: 01/10/2017 13:58   Dg Shoulder Left  Result Date: 01/10/2017 CLINICAL DATA:  Left shoulder pain beginning this morning. EXAM: LEFT SHOULDER - 2+ VIEW COMPARISON:  None. FINDINGS: No acute bony or joint abnormality is identified. Acromioclavicular osteoarthritis is seen. The glenohumeral joint appears normal. Imaged lung parenchyma and ribs are unremarkable. Aortic atherosclerosis is noted. IMPRESSION: No acute abnormality. Acromioclavicular osteoarthritis. Atherosclerosis. Electronically Signed   By: Drusilla Kanner M.D.   On: 01/10/2017 15:26   EKG: compared with tracing from 3/16.  SR, no ST elevation or depression No acute or concerning changes   Discussed with cardiology DOD- sx and overall clinical picture are not likely to be ACS- however will get a troponin for him today.  Assuming this is negative will set him up for a stress.  He is active and should be able to do a treadmill  Assessment and Plan: Acute pain of left shoulder - Plan: EKG 12-Lead, DG Shoulder Left, DG Chest 2 View, Troponin I, traMADol (ULTRAM) 50 MG tablet, Exercise Tolerance Test  Increased prostate specific antigen (PSA) velocity -  Plan: PSA, Medicare  Follow-up PSA today Here today with left shoulder pain.  His story fits with MSK pain, but I cannot reproduce his shoulder pain so would also like to try and rule- out a heart or lung issue CXR normal, EKG is benign.  Will order a stat troponin for him today- if negative plan for a ETT in the near future.  Pt is in agreement with plan Rx for tramadol to use as needed for pain- cautioned regarding sedation   Signed Abbe Amsterdam, MD  Sent out a stat troponin today- received normal result.  Called and let Harriett Sine know, she will pass info to [place]  Results for orders placed or  performed in visit on 01/10/17  Troponin I  Result Value Ref Range   TNIDX 0.00 0.00 - 0.06 ug/l

## 2017-01-19 ENCOUNTER — Ambulatory Visit (INDEPENDENT_AMBULATORY_CARE_PROVIDER_SITE_OTHER): Payer: Medicare Other

## 2017-01-19 DIAGNOSIS — M25512 Pain in left shoulder: Secondary | ICD-10-CM

## 2017-01-19 LAB — EXERCISE TOLERANCE TEST
CHL CUP MPHR: 141 {beats}/min
CHL CUP RESTING HR STRESS: 61 {beats}/min
CHL CUP STRESS STAGE 1 DBP: 62 mmHg
CHL CUP STRESS STAGE 2 SPEED: 1 mph
CHL CUP STRESS STAGE 3 HR: 77 {beats}/min
CHL CUP STRESS STAGE 3 SPEED: 1 mph
CHL CUP STRESS STAGE 4 HR: 139 {beats}/min
CHL CUP STRESS STAGE 5 DBP: 76 mmHg
CHL CUP STRESS STAGE 5 GRADE: 0 %
CHL CUP STRESS STAGE 5 HR: 131 {beats}/min
CHL CUP STRESS STAGE 6 DBP: 79 mmHg
CHL CUP STRESS STAGE 6 GRADE: 0 %
CSEPEDS: 49 s
CSEPHR: 100 %
CSEPPBP: 215 mmHg
CSEPPHR: 139 {beats}/min
CSEPPMHR: 98 %
Estimated workload: 4.6 METS
Exercise duration (min): 2 min
RPE: 15
Stage 1 Grade: 0 %
Stage 1 HR: 61 {beats}/min
Stage 1 SBP: 169 mmHg
Stage 1 Speed: 0 mph
Stage 2 Grade: 0 %
Stage 2 HR: 75 {beats}/min
Stage 3 Grade: 0 %
Stage 4 DBP: 61 mmHg
Stage 4 Grade: 10 %
Stage 4 SBP: 215 mmHg
Stage 4 Speed: 1.7 mph
Stage 5 SBP: 216 mmHg
Stage 5 Speed: 0 mph
Stage 6 HR: 81 {beats}/min
Stage 6 SBP: 203 mmHg
Stage 6 Speed: 0 mph

## 2017-01-20 ENCOUNTER — Telehealth: Payer: Self-pay | Admitting: Family Medicine

## 2017-01-20 DIAGNOSIS — M25512 Pain in left shoulder: Secondary | ICD-10-CM

## 2017-01-20 NOTE — Telephone Encounter (Signed)
Caller name: Relationship to patient: Self Can be reached: [phone] Pharmacy:  Reason for call: Patient request another Rx for pain. States the one that was given to him did not work.

## 2017-01-21 MED ORDER — ACETAMINOPHEN-CODEINE #3 300-30 MG PO TABS: 1.0000 | ORAL_TABLET | Freq: Four times a day (QID) | ORAL | 0 refills | Status: DC | PRN

## 2017-01-21 NOTE — Telephone Encounter (Signed)
Called pt-  His ETT is negative for ischemia.  His left shoulder continues to hurt off an on- he would be interested in having an ortho consultation- ?injection may be helpful for him. He used the few tramadol that I gave him, and they did help but not much more than just ibuprofen.   Will refer to [place] ortho Will call in tylenol #3

## 2017-01-24 ENCOUNTER — Other Ambulatory Visit: Payer: Medicare Other

## 2017-01-25 ENCOUNTER — Other Ambulatory Visit: Payer: Self-pay | Admitting: Family Medicine

## 2017-01-25 ENCOUNTER — Encounter: Payer: Self-pay | Admitting: Family Medicine

## 2017-01-25 DIAGNOSIS — R972 Elevated prostate specific antigen [PSA]: Secondary | ICD-10-CM

## 2017-02-16 DIAGNOSIS — M5090 Cervical disc disorder, unspecified, unspecified cervical region: Secondary | ICD-10-CM | POA: Diagnosis not present

## 2017-02-18 DIAGNOSIS — D0462 Carcinoma in situ of skin of left upper limb, including shoulder: Secondary | ICD-10-CM | POA: Diagnosis not present

## 2017-02-18 DIAGNOSIS — L821 Other seborrheic keratosis: Secondary | ICD-10-CM | POA: Diagnosis not present

## 2017-02-18 DIAGNOSIS — L84 Corns and callosities: Secondary | ICD-10-CM | POA: Diagnosis not present

## 2017-02-18 DIAGNOSIS — L570 Actinic keratosis: Secondary | ICD-10-CM | POA: Diagnosis not present

## 2017-02-18 DIAGNOSIS — D485 Neoplasm of uncertain behavior of skin: Secondary | ICD-10-CM | POA: Diagnosis not present

## 2017-02-25 DIAGNOSIS — D0462 Carcinoma in situ of skin of left upper limb, including shoulder: Secondary | ICD-10-CM | POA: Diagnosis not present

## 2017-05-13 ENCOUNTER — Other Ambulatory Visit (INDEPENDENT_AMBULATORY_CARE_PROVIDER_SITE_OTHER): Payer: Medicare Other

## 2017-05-13 DIAGNOSIS — R972 Elevated prostate specific antigen [PSA]: Secondary | ICD-10-CM | POA: Diagnosis not present

## 2017-05-13 DIAGNOSIS — H524 Presbyopia: Secondary | ICD-10-CM | POA: Diagnosis not present

## 2017-05-13 LAB — PSA, MEDICARE: PSA: 1.76 ng/mL (ref 0.10–4.00)

## 2017-05-27 ENCOUNTER — Ambulatory Visit (INDEPENDENT_AMBULATORY_CARE_PROVIDER_SITE_OTHER): Payer: Medicare Other | Admitting: Behavioral Health

## 2017-05-27 DIAGNOSIS — Z23 Encounter for immunization: Secondary | ICD-10-CM | POA: Diagnosis not present

## 2017-05-27 NOTE — Progress Notes (Signed)
Pre visit review using our clinic review tool, if applicable. No additional management support is needed unless otherwise documented below in the visit note.  Patient came in clinic for influenza vaccination. IM injection was given in the left deltoid. Patient tolerated the injection well. 

## 2017-06-03 ENCOUNTER — Encounter: Payer: Self-pay | Admitting: Family Medicine

## 2017-06-09 ENCOUNTER — Other Ambulatory Visit: Payer: Self-pay | Admitting: Emergency Medicine

## 2017-06-09 ENCOUNTER — Telehealth: Payer: Self-pay | Admitting: Family Medicine

## 2017-06-09 DIAGNOSIS — I10 Essential (primary) hypertension: Secondary | ICD-10-CM

## 2017-06-09 MED ORDER — LISINOPRIL 10 MG PO TABS: 15.0000 mg | ORAL_TABLET | Freq: Every day | ORAL | 3 refills | Status: DC

## 2017-06-09 NOTE — Telephone Encounter (Signed)
Copied from CRM #[id number]. Topic: Quick Communication - See Telephone Encounter >> Jun 09, 2017  9:01 AM Valentina LucksMatos, Jackelin wrote: CRM for notification. See Telephone encounter for:  06/09/17.  Pharmacy: [facility] [place] [address]   Pt needing refill on lisinopril (PRINIVIL,ZESTRIL) 10 MG tablet. Pt is needing 90 day supply plus 3 refills. Please advise.

## 2017-06-09 NOTE — Telephone Encounter (Signed)
Refill sent.

## 2017-06-27 ENCOUNTER — Encounter: Payer: Self-pay | Admitting: Family Medicine

## 2017-09-19 ENCOUNTER — Telehealth: Payer: Self-pay | Admitting: Emergency Medicine

## 2017-09-19 DIAGNOSIS — Z5181 Encounter for therapeutic drug level monitoring: Secondary | ICD-10-CM

## 2017-09-19 NOTE — Telephone Encounter (Signed)
Copied from CRM [phone]. Topic: Inquiry >> Sep 19, 2017 11:12 AM Crist InfanteHarrald, Kathy J wrote: Reason for CRM: pt states he gets labs every 4 months and is requesting orders so he can come into the labs.  Pt states he doesn't need an appt. Hopes he can get standing orders

## 2017-09-20 NOTE — Telephone Encounter (Signed)
Collin Rose- please give him a call back.  I am not quite sure what labs he wants to have done.  I ordered a CBC and CMP for him.  Please ask him if there is something else that he wanted.    Also please remind him that he is due to be seen for an office visit soon as it has been 6 months,  Thank you!

## 2017-09-22 ENCOUNTER — Telehealth: Payer: Self-pay | Admitting: Emergency Medicine

## 2017-09-22 NOTE — Telephone Encounter (Signed)
Pt returned.

## 2017-09-22 NOTE — Telephone Encounter (Signed)
Spoke to pt. He wanted to know if he will need a PSA test. Last one in 04/2017. Spoke to provider who states he will need to recheck PSA over the Summer. Informed pt that he does need to come in for other labs that are ordered and will need to also schedule a 6 month follow up per provider. Pt states he will come by next week to have labs done and will call back to schedule 6 month appt.

## 2017-09-23 ENCOUNTER — Other Ambulatory Visit (INDEPENDENT_AMBULATORY_CARE_PROVIDER_SITE_OTHER): Payer: Medicare Other

## 2017-09-23 DIAGNOSIS — Z5181 Encounter for therapeutic drug level monitoring: Secondary | ICD-10-CM | POA: Diagnosis not present

## 2017-09-23 LAB — COMPREHENSIVE METABOLIC PANEL
ALT: 17 U/L (ref 0–53)
AST: 20 U/L (ref 0–37)
Albumin: 3.8 g/dL (ref 3.5–5.2)
Alkaline Phosphatase: 98 U/L (ref 39–117)
BUN: 16 mg/dL (ref 6–23)
CO2: 28 meq/L (ref 19–32)
Calcium: 8.7 mg/dL (ref 8.4–10.5)
Chloride: 107 mEq/L (ref 96–112)
Creatinine, Ser: 0.92 mg/dL (ref 0.40–1.50)
GFR: 84.07 mL/min (ref >60.00)
GLUCOSE: 104 mg/dL — AB (ref 70–99)
POTASSIUM: 4.4 meq/L (ref 3.5–5.1)
Sodium: 140 mEq/L (ref 135–145)
Total Bilirubin: 0.6 mg/dL (ref 0.2–1.2)
Total Protein: 6.7 g/dL (ref 6.0–8.3)

## 2017-09-23 LAB — CBC
HCT: 38.9 % — ABNORMAL LOW (ref 39.0–52.0)
HEMOGLOBIN: 13.2 g/dL (ref 13.0–17.0)
MCHC: 34 g/dL (ref 30.0–36.0)
MCV: 100 fl (ref 78.0–100.0)
PLATELETS: 143 10*3/uL — AB (ref 150.0–400.0)
RBC: 3.89 Mil/uL — ABNORMAL LOW (ref 4.22–5.81)
RDW: 13.2 % (ref 11.5–15.5)
WBC: 5.4 10*3/uL (ref 4.0–10.5)

## 2017-09-24 ENCOUNTER — Encounter: Payer: Self-pay | Admitting: Family Medicine

## 2017-10-03 NOTE — Progress Notes (Signed)
[facility] [address], [address] [place], [place] [address] [phone] [phone]  Date:  10/05/2017   Name:  Collin FaceBoyd E Kipnis   DOB:  June 21, 1935   MRN:  [id number]  PCP:  Pearline Cablesopland, Dee Maday C, MD    Chief Complaint: No chief complaint on file.   History of Present Illness:  Collin Rose is a 83 y.o. very pleasant male patient who presents with the following:  From our last visit in April:  History of HTN, GERD, pre-diabetes, post- polio syndrome.  Here today seeking a CPE Due for labs although he did have a BMP and A1c in January  He does get PSA testing - will order for him today Based on my advice he has worked on losing some weight as below- he has lost about 17 lbs  He has cut down on what he was eating eating and has increased his exercise- he is walking 20- 30 minutes He is fasting today for his labs  He is feeling well overall, but does notice some constipation at times  He does not have any CP or SOB when he exercises He has had right knee surgery and this knee hurts sometime, but otherwise his exercise tolerance is good No recent GERD sx- as long as he watches his diet this does not trouble him  Appetite is good He does see dermatology about skin lesion on his face - has regular skin checks  He had some labs drawn recently, as below. I asked him to come in for a visit as we are due to check on his BP, etc Results for orders placed or performed in visit on 09/23/17  Comprehensive metabolic panel  Result Value Ref Range   Sodium 140 135 - 145 mEq/L   Potassium 4.4 3.5 - 5.1 mEq/L   Chloride 107 96 - 112 mEq/L   CO2 28 19 - 32 mEq/L   Glucose, Bld 104 (H) 70 - 99 mg/dL   BUN 16 6 - 23 mg/dL   Creatinine, Ser 2.950.92 0.40 - 1.50 mg/dL   Total Bilirubin 0.6 0.2 - 1.2 mg/dL   Alkaline Phosphatase 98 39 - 117 U/L   AST 20 0 - 37 U/L   ALT 17 0 - 53 U/L   Total Protein 6.7 6.0 - 8.3 g/dL   Albumin 3.8 3.5 - 5.2 g/dL   Calcium  8.7 8.4 - 28.410.5 mg/dL   GFR 13.2484.07 >40.10>60.00 mL/min  CBC  Result Value Ref Range   WBC 5.4 4.0 - 10.5 K/uL   RBC 3.89 (L) 4.22 - 5.81 Mil/uL   Platelets 143.0 (L) 150.0 - 400.0 K/uL   Hemoglobin 13.2 13.0 - 17.0 g/dL   HCT 27.238.9 (L) 53.639.0 - 64.452.0 %   MCV 100.0 78.0 - 100.0 fl   MCHC 34.0 30.0 - 36.0 g/dL   RDW 03.413.2 74.211.5 - 59.515.5 %   BP Readings from Last 3 Encounters:  10/05/17 (!) 144/64  01/10/17 140/74  12/06/16 140/63   Wt Readings from Last 3 Encounters:  10/05/17 235 lb 12.8 oz (107 kg)  01/10/17 237 lb (107.5 kg)  12/06/16 239 lb 9.6 oz (108.7 kg)   At home he does check his BP- may run 140/ 55-60 per his home meter  He is still taking 15 mg of lisinopril, and doxazosin at bedtime His family is well He is feeling well and is a bit perplexed that I asked him to come in  today   Patient Active Problem List   Diagnosis Date Noted  . Pre-diabetes 09/01/2016  . Bradycardia 04/09/2015  . Post-polio muscle weakness 02/14/2014  . Hypertension 12/13/2011  . GERD (gastroesophageal reflux disease) 12/13/2011  . Neuropathy 12/13/2011    Past Medical History:  Diagnosis Date  . Colon polyps   . Heart murmur   . Hypertension     Past Surgical History:  Procedure Laterality Date  . BACK SURGERY    . SPINE SURGERY      Social History   Tobacco Use  . Smoking status: Former Smoker    Packs/day: 1.00    Types: Cigarettes  . Smokeless tobacco: Never Used  Substance Use Topics  . Alcohol use: Yes  . Drug use: Not on file    Family History  Problem Relation Age of Onset  . Cancer Mother   . Stroke Father     No Known Allergies  Medication list has been reviewed and updated.  Current Outpatient Medications on File Prior to Visit  Medication Sig Dispense Refill  . doxazosin (CARDURA) 8 MG tablet TAKE 1 BY MOUTH AT BEDTIME 90 tablet 3  . lisinopril (PRINIVIL,ZESTRIL) 10 MG tablet Take 1.5 tablets (15 mg total) by mouth daily. 135 tablet 3   No current  facility-administered medications on file prior to visit.     Review of Systems:  As per HPI- otherwise negative. No CP or SOB  Physical Examination: Vitals:   10/05/17 0937 10/05/17 0951  BP: (!) 180/89 (!) 144/64  Pulse: 64   Resp: 16   Temp: 97.6 F (36.4 C)   SpO2: 97%    Vitals:   10/05/17 0937  Weight: 235 lb 12.8 oz (107 kg)  Height: 6\' 4"  (1.93 m)   Body mass index is 28.7 kg/m. Ideal Body Weight: Weight in (lb) to have BMI = 25: 205  GEN: WDWN, NAD, Non-toxic, A & O x 3, tall build, mild post polio sx.  Looks well  HEENT: Atraumatic, Normocephalic. Neck supple. No masses, No LAD. Ears and Nose: No external deformity. CV: RRR, No M/G/R. No JVD. No thrill. No extra heart sounds. PULM: CTA B, no wheezes, crackles, rhonchi. No retractions. No resp. distress. No accessory muscle use. EXTR: No c/c/e NEURO Normal gait.  PSYCH: Normally interactive. Conversant. Not depressed or anxious appearing.  Calm demeanor.    Assessment and Plan: Essential hypertension  BP ok on recheck today Continue current medications Asked him to see me in 6 months for a recheck and full labs   Signed Abbe Amsterdam, MD

## 2017-10-05 ENCOUNTER — Encounter: Payer: Self-pay | Admitting: Family Medicine

## 2017-10-05 ENCOUNTER — Ambulatory Visit: Payer: Medicare Other | Admitting: Family Medicine

## 2017-10-05 DIAGNOSIS — I10 Essential (primary) hypertension: Secondary | ICD-10-CM | POA: Diagnosis not present

## 2017-10-05 NOTE — Patient Instructions (Signed)
Great to see you today- please see me in about 6 months. Have a great spring

## 2017-11-02 ENCOUNTER — Telehealth: Payer: Self-pay | Admitting: *Deleted

## 2017-11-02 ENCOUNTER — Other Ambulatory Visit: Payer: Self-pay | Admitting: Emergency Medicine

## 2017-11-02 DIAGNOSIS — I10 Essential (primary) hypertension: Secondary | ICD-10-CM

## 2017-11-02 MED ORDER — DOXAZOSIN MESYLATE 8 MG PO TABS: ORAL_TABLET | ORAL | 3 refills | Status: DC

## 2017-11-02 NOTE — Telephone Encounter (Signed)
Doxazosin (CARDURA) 8 MG tablet sent to [id number] - [place], [place] - [address] per pt request.

## 2017-11-02 NOTE — Telephone Encounter (Signed)
Copied from CRM [id number]. Topic: General - Other >> Nov 02, 2017  9:23 AM Elliot GaultBell, Tiffany M wrote: Relation to pt: self Call back number: [phone] [id number] [place] [place] - [address] [address] [phone]   Reason for call:  Patient checking on the status of 90 day supply of doxazosin (CARDURA) 8 MG tablet  refill. Patient contacted mail order and they ensured request was sent over last week, please advise   >> Nov 02, 2017  9:51 AM Elliot GaultBell, Tiffany M wrote: Relation to pt: self Call back number: [phone] [id number] [place] [place] - [address] [facility] [address] [phone]   Reason for call:  Patient checking on the status of 90 day supply of doxazosin (CARDURA) 8 MG tablet  refill. Patient contacted mail order and they ensured request was sent over last week, please advise

## 2017-11-03 NOTE — Telephone Encounter (Signed)
Pharmacist called and states that the Rx was not received and would like to have it resent, contact pharmacy if needed

## 2017-11-03 NOTE — Telephone Encounter (Signed)
[facility] [place] and spoke to Clara who verified receipt of Cardura 8 mg 90 tab/3 refills by Dr. Patsy Lageropland on 11/02/17.

## 2017-11-11 ENCOUNTER — Other Ambulatory Visit: Payer: Self-pay | Admitting: Emergency Medicine

## 2018-01-18 ENCOUNTER — Encounter: Payer: Self-pay | Admitting: Family Medicine

## 2018-05-16 DIAGNOSIS — H524 Presbyopia: Secondary | ICD-10-CM | POA: Diagnosis not present

## 2018-05-17 DIAGNOSIS — H40013 Open angle with borderline findings, low risk, bilateral: Secondary | ICD-10-CM | POA: Diagnosis not present

## 2018-05-17 DIAGNOSIS — H35033 Hypertensive retinopathy, bilateral: Secondary | ICD-10-CM | POA: Diagnosis not present

## 2018-05-17 DIAGNOSIS — H3562 Retinal hemorrhage, left eye: Secondary | ICD-10-CM | POA: Diagnosis not present

## 2018-05-17 DIAGNOSIS — H2513 Age-related nuclear cataract, bilateral: Secondary | ICD-10-CM | POA: Diagnosis not present

## 2018-05-24 ENCOUNTER — Ambulatory Visit (INDEPENDENT_AMBULATORY_CARE_PROVIDER_SITE_OTHER): Payer: Medicare Other

## 2018-05-24 DIAGNOSIS — Z23 Encounter for immunization: Secondary | ICD-10-CM | POA: Diagnosis not present

## 2018-05-24 DIAGNOSIS — H2511 Age-related nuclear cataract, right eye: Secondary | ICD-10-CM | POA: Diagnosis not present

## 2018-06-06 ENCOUNTER — Other Ambulatory Visit: Payer: Self-pay | Admitting: Family Medicine

## 2018-06-06 DIAGNOSIS — I10 Essential (primary) hypertension: Secondary | ICD-10-CM

## 2018-06-06 DIAGNOSIS — H2511 Age-related nuclear cataract, right eye: Secondary | ICD-10-CM | POA: Diagnosis not present

## 2018-06-06 DIAGNOSIS — H25811 Combined forms of age-related cataract, right eye: Secondary | ICD-10-CM | POA: Diagnosis not present

## 2018-06-06 MED ORDER — LISINOPRIL 10 MG PO TABS
15.0000 mg | ORAL_TABLET | Freq: Every day | ORAL | 0 refills | Status: DC
Start: 2018-06-06 — End: 2018-08-03

## 2018-06-06 NOTE — Telephone Encounter (Signed)
Copied from CRM [id number]. Topic: Quick Communication - Rx Refill/Question >> Jun 06, 2018  2:00 PM Maia Petties wrote: Medication: lisinopril (PRINIVIL,ZESTRIL) 10 MG tablet - pt has 10 days left - 90 day supply for mail order - request was faxed 06/02/18 - checking status Fax # [phone]  Has the patient contacted their pharmacy? Yes - pharmacy calling Preferred Pharmacy (with phone number or street name): [facility] - [place], [place] - [address] [phone] (Phone) [phone] (Fax)

## 2018-06-06 NOTE — Telephone Encounter (Signed)
Attempted to contact pt; last office visit 10/05/17; after visit summary from that date requests follow up in 6 months; no past or upcoming visits noted; left message on voicemail [phone].

## 2018-06-14 DIAGNOSIS — H2512 Age-related nuclear cataract, left eye: Secondary | ICD-10-CM | POA: Diagnosis not present

## 2018-06-27 DIAGNOSIS — H2512 Age-related nuclear cataract, left eye: Secondary | ICD-10-CM | POA: Diagnosis not present

## 2018-06-27 DIAGNOSIS — H25812 Combined forms of age-related cataract, left eye: Secondary | ICD-10-CM | POA: Diagnosis not present

## 2018-07-24 ENCOUNTER — Encounter: Payer: Medicare Other | Admitting: Family Medicine

## 2018-08-01 NOTE — Progress Notes (Addendum)
[facility] [address], [address] [place], [place] [address] [phone] [phone]  Date:  08/03/2018   Name:  Collin Rose   DOB:  16-Mar-1935   MRN:  [id number]  PCP:  Pearline Cablesopland, Sandy Haye C, MD    Chief Complaint: Annual Exam (medication refills)   History of Present Illness:  Collin Rose is a 83 y.o. very pleasant male patient who presents with the following:  Here today for a physical exam.  However, we will not code as such as patient does have Medicare. History of hypertension, neuropathy, GERD, prediabetes, and patient is post polio.  He is a former smoker. He smoked about 20 years, he thinks less than a PPD Quit 40 years ago  Too old for AAA screening   Labs: can do full panel today  Immun: can offer tetanus [facility]  Flu shot done  Colon: 4/18  Our last visit was in February.  At that time he was doing quite well. He does like to have PSA testing. His neuropathy is not bothersome to him  States that he manages his feet and toenails himself  He had his cataracts removed- bilaterally per [facility]. He still has to use eye drops 4x a day for swelling but is otherwise doing ok   [facility] skin check   BP Readings from Last 3 Encounters:  08/03/18 (!) 152/80  10/05/17 (!) 144/64  01/10/17 140/74     Patient Active Problem List   Diagnosis Date Noted  . Pre-diabetes 09/01/2016  . Bradycardia 04/09/2015  . Post-polio muscle weakness 02/14/2014  . Hypertension 12/13/2011  . GERD (gastroesophageal reflux disease) 12/13/2011  . Neuropathy 12/13/2011    Past Medical History:  Diagnosis Date  . Colon polyps   . Heart murmur   . Hypertension     Past Surgical History:  Procedure Laterality Date  . BACK SURGERY    . SPINE SURGERY      Social History   Tobacco Use  . Smoking status: Former Smoker    Packs/day: 1.00    Types:  Cigarettes  . Smokeless tobacco: Never Used  Substance Use Topics  . Alcohol use: Yes  . Drug use: Not on file    Family History  Problem Relation Age of Onset  . Cancer Mother   . Stroke Father     No Known Allergies  Medication list has been reviewed and updated.  Current Outpatient Medications on File Prior to Visit  Medication Sig Dispense Refill  . doxazosin (CARDURA) 8 MG tablet TAKE 1 BY MOUTH AT BEDTIME 90 tablet 3  . lisinopril (PRINIVIL,ZESTRIL) 10 MG tablet Take 1.5 tablets (15 mg total) by mouth daily. 45 tablet 0   No current facility-administered medications on file prior to visit.     Review of Systems:  As per HPI- otherwise negative. No fever or chills No CP or SOB  Physical Examination: Vitals:   08/03/18 0928  BP: (!) 152/80  Pulse: 61  Resp: 16  Temp: 98 F (36.7 C)  SpO2: 98%   Vitals:   08/03/18 0928  Weight: 239 lb (108.4 kg)  Height: 6\' 4"  (1.93 m)   Body mass index is 29.09 kg/m. Ideal Body Weight: Weight in (lb) to have BMI = 25: 205  Repeated BP- no change  GEN: WDWN, NAD, Non-toxic, A & O x  3, tall build, looks well  HEENT: Atraumatic, Normocephalic. Neck supple. No masses, No LAD.  Bilateral TM wnl, oropharynx normal.  PEERL,EOMI.   Ears and Nose: No external deformity. CV: RRR, No M/G/R. No JVD. No thrill. No extra heart sounds. PULM: CTA B, no wheezes, crackles, rhonchi. No retractions. No resp. distress. No accessory muscle use. ABD: S, NT, ND. No rebound. No HSM. EXTR: No c/c/e NEURO Normal gait.  PSYCH: Normally interactive. Conversant. Not depressed or anxious appearing.  Calm demeanor.  Foot exam: Patient has diffuse calluses, and overgrown toenails on both feet.  However pulses and sensation to monofilament are normal.  Assessment and Plan: Elevated glucose - Plan: Comprehensive metabolic panel, Hemoglobin A1c  Post-polio limb muscle weakness  Essential hypertension - Plan: CBC, Comprehensive metabolic panel,  doxazosin (CARDURA) 8 MG tablet, lisinopril (PRINIVIL,ZESTRIL) 20 MG tablet, DISCONTINUED: doxazosin (CARDURA) 8 MG tablet, DISCONTINUED: lisinopril (PRINIVIL,ZESTRIL) 20 MG tablet  Medication monitoring encounter  Increased prostate specific antigen (PSA) velocity - Plan: PSA  Annual physical exam  Hyperlipidemia, unspecified hyperlipidemia type - Plan: Lipid panel   Here today for physical exam.  Overall he is doing quite well. Increase lisinopril from 15 to 20 mg, as blood pressure is elevated today. Labs pending as above, will be in touch with patient pending results. He is getting Shingrix from his drugstore. Suggested that he get his tetanus there too at his convenience  Signed Abbe Amsterdam, MD   Labs received, letter to pt   Results for orders placed or performed in visit on 08/03/18  CBC  Result Value Ref Range   WBC 5.2 4.0 - 10.5 K/uL   RBC 3.93 (L) 4.22 - 5.81 Mil/uL   Platelets 153.0 150.0 - 400.0 K/uL   Hemoglobin 13.7 13.0 - 17.0 g/dL   HCT 16.1 09.6 - 04.5 %   MCV 102.8 (H) 78.0 - 100.0 fl   MCHC 34.0 30.0 - 36.0 g/dL   RDW 40.9 81.1 - 91.4 %  Comprehensive metabolic panel  Result Value Ref Range   Sodium 142 135 - 145 mEq/L   Potassium 4.8 3.5 - 5.1 mEq/L   Chloride 107 96 - 112 mEq/L   CO2 30 19 - 32 mEq/L   Glucose, Bld 131 (H) 70 - 99 mg/dL   BUN 17 6 - 23 mg/dL   Creatinine, Ser 7.82 0.40 - 1.50 mg/dL   Total Bilirubin 0.9 0.2 - 1.2 mg/dL   Alkaline Phosphatase 83 39 - 117 U/L   AST 18 0 - 37 U/L   ALT 20 0 - 53 U/L   Total Protein 6.5 6.0 - 8.3 g/dL   Albumin 4.0 3.5 - 5.2 g/dL   Calcium 9.0 8.4 - 95.6 mg/dL   GFR 21.30 >86.57 mL/min  Hemoglobin A1c  Result Value Ref Range   Hgb A1c MFr Bld 5.6 4.6 - 6.5 %  Lipid panel  Result Value Ref Range   Cholesterol 135 0 - 200 mg/dL   Triglycerides 84.6 0.0 - 149.0 mg/dL   HDL 96.29 >52.84 mg/dL   VLDL 13.2 0.0 - 44.0 mg/dL   LDL Cholesterol 77 0 - 99 mg/dL   Total CHOL/HDL Ratio 3    NonHDL  89.00   PSA  Result Value Ref Range   PSA 0.91 0.10 - 4.00 ng/mL   Lab Results  Component Value Date   PSA 0.91 08/03/2018   PSA 1.76 05/13/2017   PSA 2.79 01/10/2017

## 2018-08-03 ENCOUNTER — Encounter: Payer: Self-pay | Admitting: Family Medicine

## 2018-08-03 ENCOUNTER — Ambulatory Visit (INDEPENDENT_AMBULATORY_CARE_PROVIDER_SITE_OTHER): Payer: Medicare Other | Admitting: Family Medicine

## 2018-08-03 VITALS — BP 152/80 | HR 61 | Temp 98.0°F | Resp 16 | Ht 76.0 in | Wt 239.0 lb

## 2018-08-03 DIAGNOSIS — B91 Sequelae of poliomyelitis: Secondary | ICD-10-CM

## 2018-08-03 DIAGNOSIS — E785 Hyperlipidemia, unspecified: Secondary | ICD-10-CM | POA: Diagnosis not present

## 2018-08-03 DIAGNOSIS — Z5181 Encounter for therapeutic drug level monitoring: Secondary | ICD-10-CM | POA: Diagnosis not present

## 2018-08-03 DIAGNOSIS — I10 Essential (primary) hypertension: Secondary | ICD-10-CM

## 2018-08-03 DIAGNOSIS — M6281 Muscle weakness (generalized): Secondary | ICD-10-CM | POA: Diagnosis not present

## 2018-08-03 DIAGNOSIS — R972 Elevated prostate specific antigen [PSA]: Secondary | ICD-10-CM | POA: Diagnosis not present

## 2018-08-03 DIAGNOSIS — Z0000 Encounter for general adult medical examination without abnormal findings: Secondary | ICD-10-CM

## 2018-08-03 DIAGNOSIS — R7309 Other abnormal glucose: Secondary | ICD-10-CM | POA: Diagnosis not present

## 2018-08-03 LAB — COMPREHENSIVE METABOLIC PANEL
ALT: 20 U/L (ref 0–53)
AST: 18 U/L (ref 0–37)
Albumin: 4 g/dL (ref 3.5–5.2)
Alkaline Phosphatase: 83 U/L (ref 39–117)
BUN: 17 mg/dL (ref 6–23)
CALCIUM: 9 mg/dL (ref 8.4–10.5)
CO2: 30 mEq/L (ref 19–32)
CREATININE: 0.94 mg/dL (ref 0.40–1.50)
Chloride: 107 mEq/L (ref 96–112)
GFR: 81.84 mL/min (ref >60.00)
Glucose, Bld: 131 mg/dL — ABNORMAL HIGH (ref 70–99)
Potassium: 4.8 mEq/L (ref 3.5–5.1)
SODIUM: 142 meq/L (ref 135–145)
TOTAL PROTEIN: 6.5 g/dL (ref 6.0–8.3)
Total Bilirubin: 0.9 mg/dL (ref 0.2–1.2)

## 2018-08-03 LAB — HEMOGLOBIN A1C: HEMOGLOBIN A1C: 5.6 % (ref 4.6–6.5)

## 2018-08-03 LAB — CBC
HEMATOCRIT: 40.4 % (ref 39.0–52.0)
HEMOGLOBIN: 13.7 g/dL (ref 13.0–17.0)
MCHC: 34 g/dL (ref 30.0–36.0)
MCV: 102.8 fl — AB (ref 78.0–100.0)
PLATELETS: 153 10*3/uL (ref 150.0–400.0)
RBC: 3.93 Mil/uL — ABNORMAL LOW (ref 4.22–5.81)
RDW: 12.4 % (ref 11.5–15.5)
WBC: 5.2 10*3/uL (ref 4.0–10.5)

## 2018-08-03 LAB — LIPID PANEL
CHOL/HDL RATIO: 3
CHOLESTEROL: 135 mg/dL (ref 0–200)
HDL: 46.3 mg/dL (ref >39.00)
LDL CALC: 77 mg/dL (ref 0–99)
NonHDL: 89
TRIGLYCERIDES: 58 mg/dL (ref 0.0–149.0)
VLDL: 11.6 mg/dL (ref 0.0–40.0)

## 2018-08-03 LAB — PSA: PSA: 0.91 ng/mL (ref 0.10–4.00)

## 2018-08-03 MED ORDER — LISINOPRIL 20 MG PO TABS: 20.0000 mg | ORAL_TABLET | Freq: Every day | ORAL | 3 refills | Status: DC

## 2018-08-03 MED ORDER — DOXAZOSIN MESYLATE 8 MG PO TABS: ORAL_TABLET | ORAL | 3 refills | Status: DC

## 2018-08-03 NOTE — Patient Instructions (Signed)
It was good to see you today, as always.  I will be in touch with your labs ASAP. We increased your lisinopril dose to 20 mg today.  This means you will take just 1 pill a day. Your toenails are overgrown.  Please work on cutting these back.  If you are not able to cut them back at home, I am glad to have you see a foot doctor.  You also might try a nail salon, to see if they can help you cut them back.  You are also due for a tetanus shot.  When you go for your neck she has had seen, ask if the [facility] pharmacist can give you this as well.  Health Maintenance, Male A healthy lifestyle and preventive care is important for your health and wellness. Ask your health care provider about what schedule of regular examinations is right for you. What should I know about weight and diet? Eat a Healthy Diet  Eat plenty of vegetables, fruits, whole grains, low-fat dairy products, and lean protein.  Do not eat a lot of foods high in solid fats, added sugars, or salt.  Maintain a Healthy Weight Regular exercise can help you achieve or maintain a healthy weight. You should:  Do at least 150 minutes of exercise each week. The exercise should increase your heart rate and make you sweat (moderate-intensity exercise).  Do strength-training exercises at least twice a week.  Watch Your Levels of Cholesterol and Blood Lipids  Have your blood tested for lipids and cholesterol every 5 years starting at 81 years of age. If you are at high risk for heart disease, you should start having your blood tested when you are 81 years old. You may need to have your cholesterol levels checked more often if: ? Your lipid or cholesterol levels are high. ? You are older than 81 years of age. ? You are at high risk for heart disease.  What should I know about cancer screening? Many types of cancers can be detected early and may often be prevented. Lung Cancer  You should be screened every year for lung cancer if: ? You  are a current smoker who has smoked for at least 30 years. ? You are a former smoker who has quit within the past 15 years.  Talk to your health care provider about your screening options, when you should start screening, and how often you should be screened.  Colorectal Cancer  Routine colorectal cancer screening usually begins at 81 years of age and should be repeated every 5-10 years until you are 81 years old. You may need to be screened more often if early forms of precancerous polyps or small growths are found. Your health care provider may recommend screening at an earlier age if you have risk factors for colon cancer.  Your health care provider may recommend using home test kits to check for hidden blood in the stool.  A small camera at the end of a tube can be used to examine your colon (sigmoidoscopy or colonoscopy). This checks for the earliest forms of colorectal cancer.  Prostate and Testicular Cancer  Depending on your age and overall health, your health care provider may do certain tests to screen for prostate and testicular cancer.  Talk to your health care provider about any symptoms or concerns you have about testicular or prostate cancer.  Skin Cancer  Check your skin from head to toe regularly.  Tell your health care provider about any new  moles or changes in moles, especially if: ? There is a change in a mole's size, shape, or color. ? You have a mole that is larger than a pencil eraser.  Always use sunscreen. Apply sunscreen liberally and repeat throughout the day.  Protect yourself by wearing long sleeves, pants, a wide-brimmed hat, and sunglasses when outside.  What should I know about heart disease, diabetes, and high blood pressure?  If you are 2818-81 years of age, have your blood pressure checked every 3-5 years. If you are 81 years of age or older, have your blood pressure checked every year. You should have your blood pressure measured twice-once when you  are at a hospital or clinic, and once when you are not at a hospital or clinic. Record the average of the two measurements. To check your blood pressure when you are not at a hospital or clinic, you can use: ? An automated blood pressure machine at a pharmacy. ? A home blood pressure monitor.  Talk to your health care provider about your target blood pressure.  If you are between 6445-81 years old, ask your health care provider if you should take aspirin to prevent heart disease.  Have regular diabetes screenings by checking your fasting blood sugar level. ? If you are at a normal weight and have a low risk for diabetes, have this test once every three years after the age of 81. ? If you are overweight and have a high risk for diabetes, consider being tested at a younger age or more often.  A one-time screening for abdominal aortic aneurysm (AAA) by ultrasound is recommended for men aged 65-75 years who are current or former smokers. What should I know about preventing infection? Hepatitis B If you have a higher risk for hepatitis B, you should be screened for this virus. Talk with your health care provider to find out if you are at risk for hepatitis B infection. Hepatitis C Blood testing is recommended for:  Everyone born from [birth year] through 1965.  Anyone with known risk factors for hepatitis C.  Sexually Transmitted Diseases (STDs)  You should be screened each year for STDs including gonorrhea and chlamydia if: ? You are sexually active and are younger than 81 years of age. ? You are older than 81 years of age and your health care provider tells you that you are at risk for this type of infection. ? Your sexual activity has changed since you were last screened and you are at an increased risk for chlamydia or gonorrhea. Ask your health care provider if you are at risk.  Talk with your health care provider about whether you are at high risk of being infected with HIV. Your health care  provider may recommend a prescription medicine to help prevent HIV infection.  What else can I do?  Schedule regular health, dental, and eye exams.  Stay current with your vaccines (immunizations).  Do not use any tobacco products, such as cigarettes, chewing tobacco, and e-cigarettes. If you need help quitting, ask your health care provider.  Limit alcohol intake to no more than 2 drinks per day. One drink equals 12 ounces of beer, 5 ounces of wine, or 1 ounces of hard liquor.  Do not use street drugs.  Do not share needles.  Ask your health care provider for help if you need support or information about quitting drugs.  Tell your health care provider if you often feel depressed.  Tell your health care provider if you  have ever been abused or do not feel safe at home. This information is not intended to replace advice given to you by your health care provider. Make sure you discuss any questions you have with your health care provider. Document Released: 02/05/2008 Document Revised: 04/07/2016 Document Reviewed: 05/13/2015 Elsevier Interactive Patient Education  [employer].

## 2018-08-18 DIAGNOSIS — Z85828 Personal history of other malignant neoplasm of skin: Secondary | ICD-10-CM | POA: Diagnosis not present

## 2018-08-18 DIAGNOSIS — L570 Actinic keratosis: Secondary | ICD-10-CM | POA: Diagnosis not present

## 2018-08-18 DIAGNOSIS — L821 Other seborrheic keratosis: Secondary | ICD-10-CM | POA: Diagnosis not present

## 2018-08-18 DIAGNOSIS — L853 Xerosis cutis: Secondary | ICD-10-CM | POA: Diagnosis not present

## 2018-10-05 DIAGNOSIS — H40013 Open angle with borderline findings, low risk, bilateral: Secondary | ICD-10-CM | POA: Diagnosis not present

## 2018-10-05 DIAGNOSIS — Z961 Presence of intraocular lens: Secondary | ICD-10-CM | POA: Diagnosis not present

## 2018-10-05 DIAGNOSIS — H59033 Cystoid macular edema following cataract surgery, bilateral: Secondary | ICD-10-CM | POA: Diagnosis not present

## 2018-10-05 DIAGNOSIS — H3562 Retinal hemorrhage, left eye: Secondary | ICD-10-CM | POA: Diagnosis not present

## 2019-03-25 IMAGING — CR DG SHOULDER 2+V*L*
3 series · 3 of 3 positions shown · non-contrast
Comparison: None.

CLINICAL DATA: Left shoulder pain beginning this morning.

EXAM:
LEFT SHOULDER - 2+ VIEW

[w shoulder grashey left]
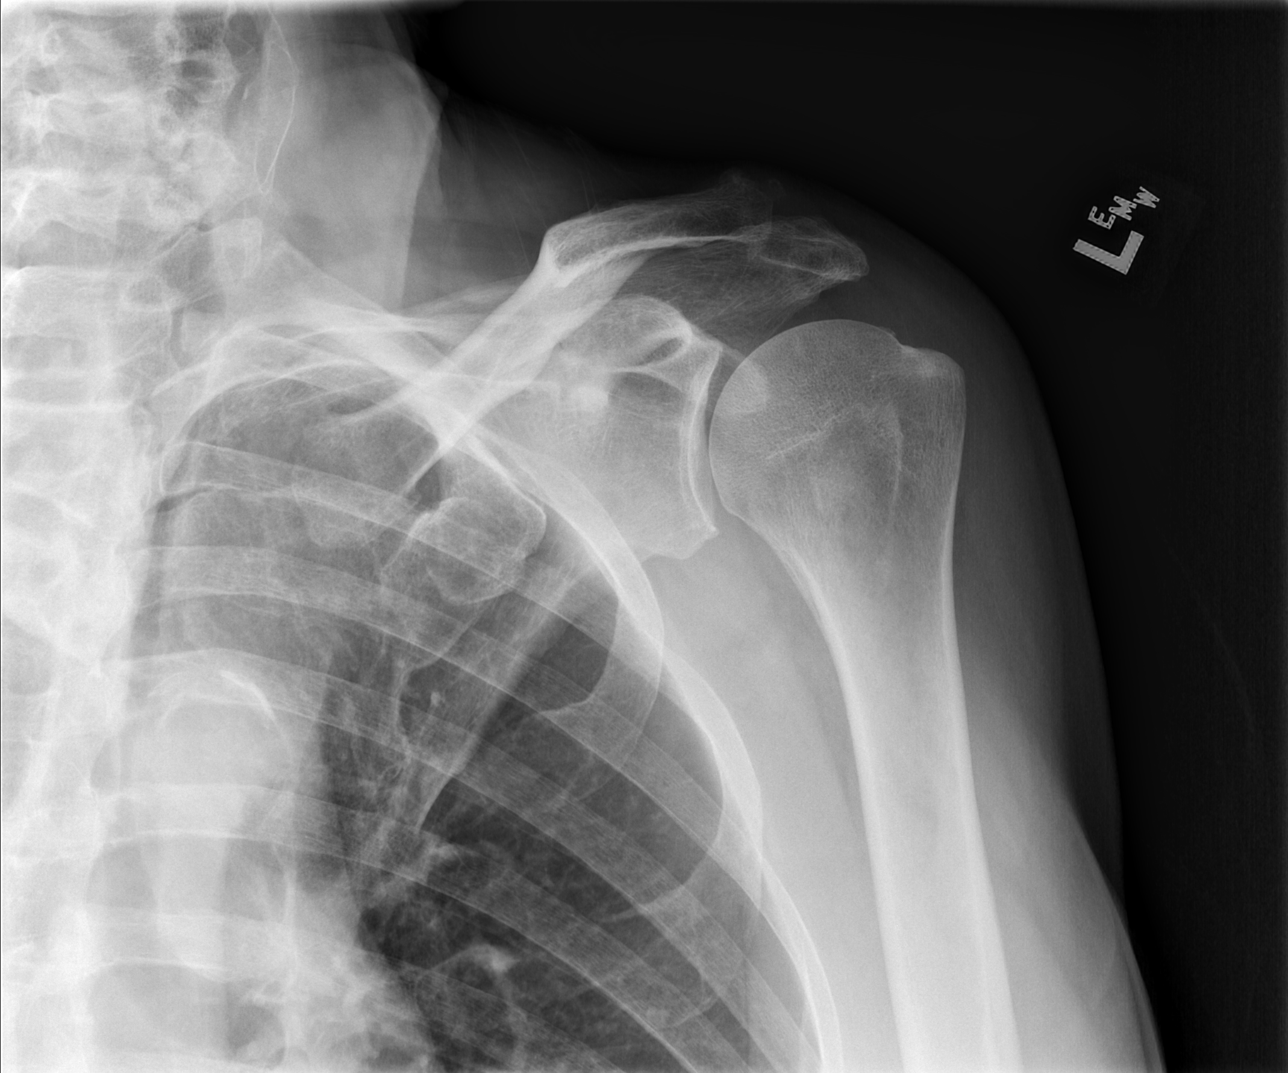

[w shoulder y view left]
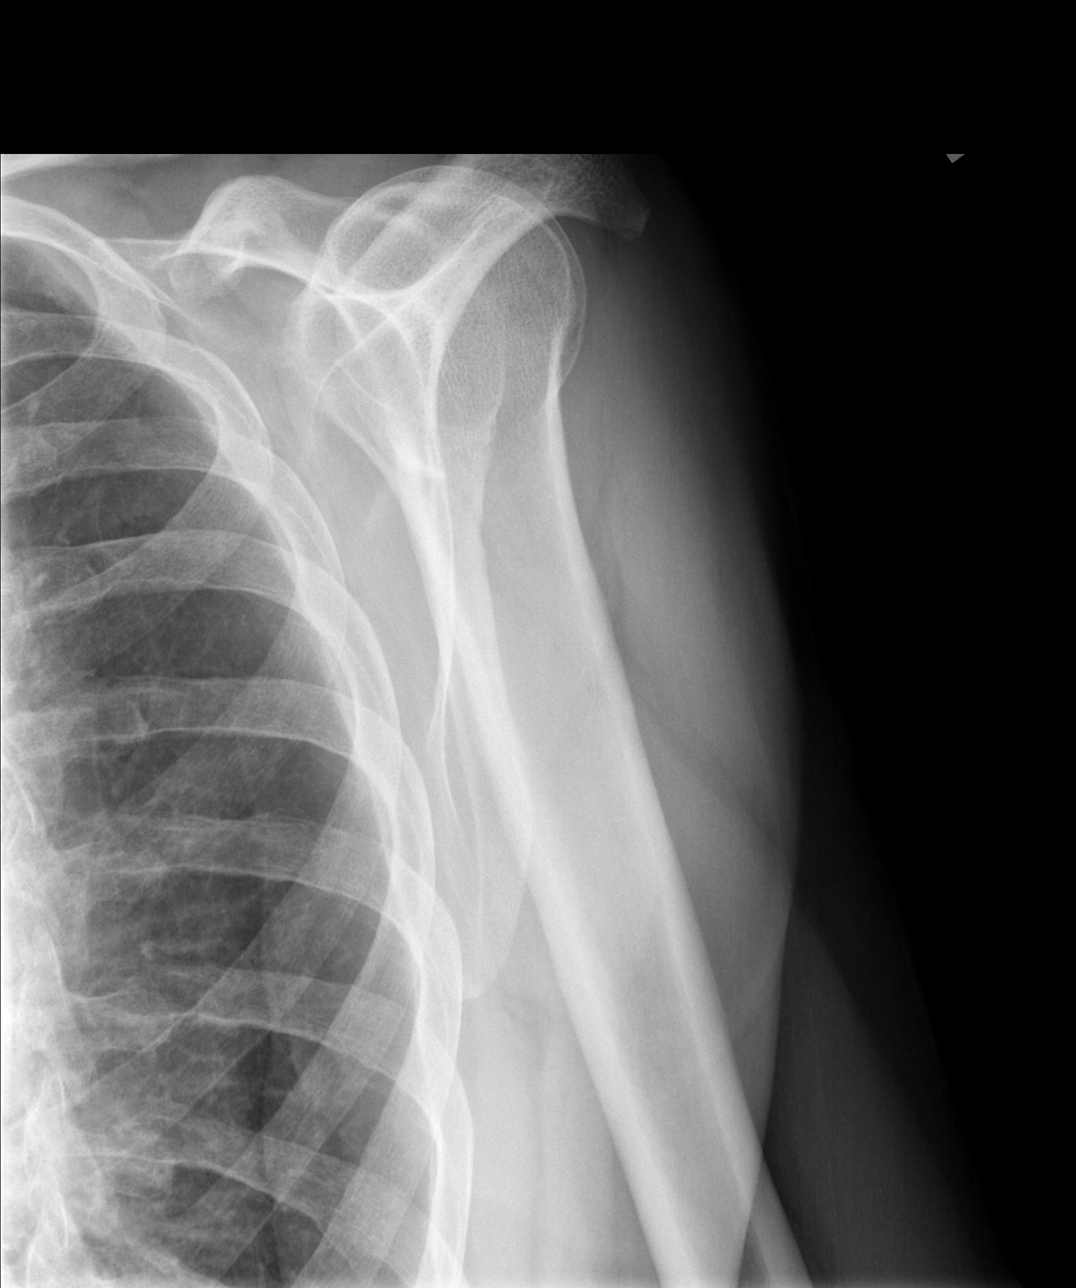

[x shoulder axillary left]
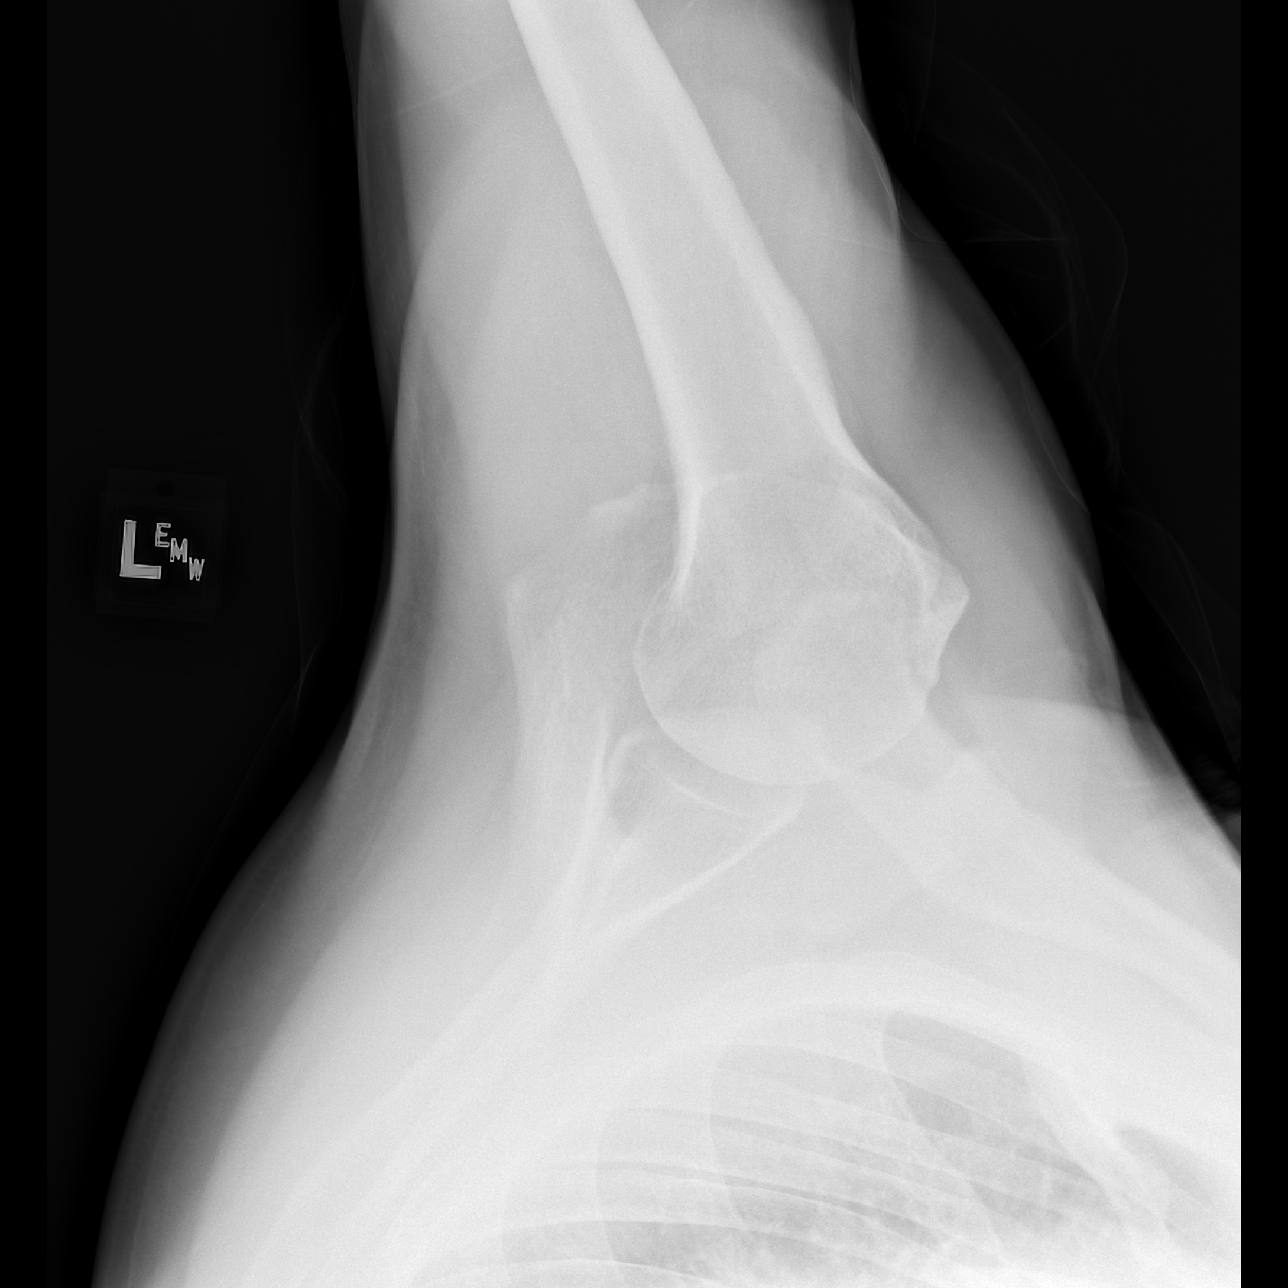

[3 of 3 positions shown; findings below may reference images not displayed]

FINDINGS: No acute bony or joint abnormality is identified. Acromioclavicular
osteoarthritis is seen. The glenohumeral joint appears normal.
Imaged lung parenchyma and ribs are unremarkable. Aortic
atherosclerosis is noted.
IMPRESSION: No acute abnormality.

Acromioclavicular osteoarthritis.

Atherosclerosis.

## 2019-05-14 ENCOUNTER — Other Ambulatory Visit: Payer: Self-pay

## 2019-05-15 ENCOUNTER — Other Ambulatory Visit: Payer: Self-pay

## 2019-05-15 ENCOUNTER — Ambulatory Visit (INDEPENDENT_AMBULATORY_CARE_PROVIDER_SITE_OTHER): Payer: Medicare Other

## 2019-05-15 DIAGNOSIS — Z23 Encounter for immunization: Secondary | ICD-10-CM | POA: Diagnosis not present

## 2019-08-04 NOTE — Progress Notes (Addendum)
[facility] [address], [address] [place], [place] [address] [phone]  Date:  08/06/2019   Name:  Collin Rose   DOB:  Dec 22, 1934   MRN:  [id number]  PCP:  Pearline Cables, MD    Chief Complaint: Annual Exam   History of Present Illness:  Collin Rose is a 84 y.o. very pleasant male patient who presents with the following:  Here today for a CPE History of pre-diabetes, HTN, post polio muscle weakness, GERD, neuropathy Last seen by myself about 1 year ago  He is a former smoker, pack years do not meet qualification for CT screening He has a dermatologist and gets annual skin checks Due for labs; he is fasting today, just had coffee Colon cancer screening up-to-date Flu shot done Can mention Shingrix- per pt done   He needs refills of his medications today He may get sinus congestion on occasion-   No CP or SOB He is doing his own yard work- mowing, raking, 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 Pre-diabetes 09/01/2016  . Bradycardia 04/09/2015  . Post-polio muscle weakness 02/14/2014  . Hypertension 12/13/2011  . GERD (gastroesophageal reflux disease) 12/13/2011  . Neuropathy 12/13/2011    Past Medical History:  Diagnosis Date  . Colon polyps   . Heart murmur   . Hypertension     Past Surgical History:  Procedure Laterality Date  . BACK SURGERY    . SPINE SURGERY      Social History   Tobacco Use  . Smoking status: Former Smoker    Packs/day: 1.00    Types: Cigarettes  . Smokeless tobacco: Never Used  Substance Use Topics  . Alcohol use: Yes  . Drug use: Not on file    Family History  Problem  Relation Age of Onset  . Cancer Mother   . Stroke Father     No Known Allergies  Medication list has been reviewed and updated.  Current Outpatient Medications on File Prior to Visit  Medication Sig Dispense Refill  . doxazosin (CARDURA) 8 MG tablet TAKE 1 BY MOUTH AT BEDTIME 90 tablet 3  . lisinopril (PRINIVIL,ZESTRIL) 20 MG tablet Take 1 tablet (20 mg total) by mouth daily. 90 tablet 3   No current facility-administered medications on file prior to visit.    Review of Systems:  As per HPI- otherwise negative.  No LE swelling Pt reports he had shingrix x2 last year   Physical Examination: Vitals:   08/06/19 0819  BP: 128/60  Pulse: 67  Resp: 18  Temp: (!) 95.9 F (35.5 C)  SpO2: 97%   Vitals:   08/06/19 0819  Weight: 243 lb (110.2 kg)  Height: 6\' 4"  (1.93 m)   Body mass index is 29.58 kg/m. Ideal Body Weight: Weight in (lb) to have BMI = 25: 205  GEN: WDWN, NAD, Non-toxic, A & O x 3, overweight, looks well, tall build HEENT: Atraumatic, Normocephalic. Neck supple. No masses, No LAD. Ears and Nose: No external deformity. CV: RRR, No M/G/R. No JVD. No thrill. No extra heart sounds.  PULM: CTA B, no wheezes, crackles, rhonchi. No retractions. No resp. distress. No accessory muscle use. ABD: S, NT, ND, +BS. No rebound. No HSM. EXTR: No c/c/e NEURO Normal gait.  PSYCH: Normally interactive. Conversant. Not depressed or anxious appearing.  Calm demeanor.    Assessment and Plan: Annual physical exam  Elevated glucose - Plan: Hemoglobin A1c  Post-polio limb muscle weakness  Increased prostate specific antigen (PSA) velocity - Plan: PSA  Essential hypertension - Plan: CBC, Comprehensive metabolic panel, lisinopril (ZESTRIL) 20 MG tablet, doxazosin (CARDURA) 8 MG tablet  Hyperlipidemia, unspecified hyperlipidemia type - Plan: Lipid panel  CPE today BP controlled Refilled medications He reports getting up to urinate once nightly Will plan further follow- up  pending labs.  This visit occurred during the SARS-CoV-2 public health emergency.  Safety protocols were in place, including screening questions prior to the visit, additional usage of staff PPE, and extensive cleaning of exam room while observing appropriate contact time as indicated for disinfecting solutions.    Signed Lamar Blinks, MD  Received his labs 12/14-letter to pt Results for orders placed or performed in visit on 08/06/19  CBC  Result Value Ref Range   WBC 5.5 4.0 - 10.5 K/uL   RBC 3.87 (L) 4.22 - 5.81 Mil/uL   Platelets 155.0 150.0 - 400.0 K/uL   Hemoglobin 13.8 13.0 - 17.0 g/dL   HCT 40.5 39.0 - 52.0 %   MCV 104.7 (H) 78.0 - 100.0 fl   MCHC 34.0 30.0 - 36.0 g/dL   RDW 12.7 11.5 - 15.5 %  Comprehensive metabolic panel  Result Value Ref Range   Sodium 139 135 - 145 mEq/L   Potassium 4.7 3.5 - 5.1 mEq/L   Chloride 103 96 - 112 mEq/L   CO2 27 19 - 32 mEq/L   Glucose, Bld 107 (H) 70 - 99 mg/dL   BUN 14 6 - 23 mg/dL   Creatinine, Ser 0.86 0.40 - 1.50 mg/dL   Total Bilirubin 1.0 0.2 - 1.2 mg/dL   Alkaline Phosphatase 77 39 - 117 U/L   AST 19 0 - 37 U/L   ALT 13 0 - 53 U/L   Total Protein 6.7 6.0 - 8.3 g/dL   Albumin 4.2 3.5 - 5.2 g/dL   GFR 85.11 >60.00 mL/min   Calcium 9.0 8.4 - 10.5 mg/dL  Hemoglobin A1c  Result Value Ref Range   Hgb A1c MFr Bld 5.3 4.6 - 6.5 %  Lipid panel  Result Value Ref Range   Cholesterol 183 0 - 200 mg/dL   Triglycerides 43.0 0.0 - 149.0 mg/dL   HDL 83.70 >39.00 mg/dL   VLDL 8.6 0.0 - 40.0 mg/dL   LDL Cholesterol 91 0 - 99 mg/dL   Total CHOL/HDL Ratio 2    NonHDL 99.68   PSA  Result Value Ref Range   PSA 0.89 0.10 - 4.00 ng/mL

## 2019-08-04 NOTE — Patient Instructions (Addendum)
It was great to see you again today, I will be in touch with your labs ASAP  You might consider getting the Shingrix shingles vaccine at your drugstore at your convenience  For occasional sinus symptoms I would recommend plain Mucinex to loosen and blow out mucus,and perhaps a nasal steroid spray such as flonase as well   Continue to stay active  Please see me in about 6 months     Health Maintenance After Age 82 After age 36, you are at a higher risk for certain long-term diseases and infections as well as injuries from falls. Falls are a major cause of broken bones and head injuries in people who are older than age 49. Getting regular preventive care can help to keep you healthy and well. Preventive care includes getting regular testing and making lifestyle changes as recommended by your health care provider. Talk with your health care provider about:  Which screenings and tests you should have. A screening is a test that checks for a disease when you have no symptoms.  A diet and exercise plan that is right for you. What should I know about screenings and tests to prevent falls? Screening and testing are the best ways to find a health problem early. Early diagnosis and treatment give you the best chance of managing medical conditions that are common after age 81. Certain conditions and lifestyle choices may make you more likely to have a fall. Your health care provider may recommend:  Regular vision checks. Poor vision and conditions such as cataracts can make you more likely to have a fall. If you wear glasses, make sure to get your prescription updated if your vision changes.  Medicine review. Work with your health care provider to regularly review all of the medicines you are taking, including over-the-counter medicines. Ask your health care provider about any side effects that may make you more likely to have a fall. Tell your health care provider if any medicines that you take make you  feel dizzy or sleepy.  Osteoporosis screening. Osteoporosis is a condition that causes the bones to get weaker. This can make the bones weak and cause them to break more easily.  Blood pressure screening. Blood pressure changes and medicines to control blood pressure can make you feel dizzy.  Strength and balance checks. Your health care provider may recommend certain tests to check your strength and balance while standing, walking, or changing positions.  Foot health exam. Foot pain and numbness, as well as not wearing proper footwear, can make you more likely to have a fall.  Depression screening. You may be more likely to have a fall if you have a fear of falling, feel emotionally low, or feel unable to do activities that you used to do.  Alcohol use screening. Using too much alcohol can affect your balance and may make you more likely to have a fall. What actions can I take to lower my risk of falls? General instructions  Talk with your health care provider about your risks for falling. Tell your health care provider if: ? You fall. Be sure to tell your health care provider about all falls, even ones that seem minor. ? You feel dizzy, sleepy, or off-balance.  Take over-the-counter and prescription medicines only as told by your health care provider. These include any supplements.  Eat a healthy diet and maintain a healthy weight. A healthy diet includes low-fat dairy products, low-fat (lean) meats, and fiber from whole grains, beans, and lots  of fruits and vegetables. Home safety  Remove any tripping hazards, such as rugs, cords, and clutter.  Install safety equipment such as grab bars in bathrooms and safety rails on stairs.  Keep rooms and walkways well-lit. Activity   Follow a regular exercise program to stay fit. This will help you maintain your balance. Ask your health care provider what types of exercise are appropriate for you.  If you need a cane or walker, use it as  recommended by your health care provider.  Wear supportive shoes that have nonskid soles. Lifestyle  Do not drink alcohol if your health care provider tells you not to drink.  If you drink alcohol, limit how much you have: ? 0-1 drink a day for women. ? 0-2 drinks a day for men.  Be aware of how much alcohol is in your drink. In the [place] one drink equals one typical bottle of beer (12 oz), one-half glass of wine (5 oz), or one shot of hard liquor (1 oz).  Do not use any products that contain nicotine or tobacco, such as cigarettes and e-cigarettes. If you need help quitting, ask your health care provider. Summary  Having a healthy lifestyle and getting preventive care can help to protect your health and wellness after age 40.  Screening and testing are the best way to find a health problem early and help you avoid having a fall. Early diagnosis and treatment give you the best chance for managing medical conditions that are more common for people who are older than age 63.  Falls are a major cause of broken bones and head injuries in people who are older than age 49. Take precautions to prevent a fall at home.  Work with your health care provider to learn what changes you can make to improve your health and wellness and to prevent falls. This information is not intended to replace advice given to you by your health care provider. Make sure you discuss any questions you have with your health care provider. Document Released: 06/22/2017 Document Revised: 11/30/2018 Document Reviewed: 06/22/2017 Elsevier Patient Education  2020 [employer].

## 2019-08-06 ENCOUNTER — Ambulatory Visit (INDEPENDENT_AMBULATORY_CARE_PROVIDER_SITE_OTHER): Payer: Medicare Other | Admitting: Family Medicine

## 2019-08-06 ENCOUNTER — Other Ambulatory Visit: Payer: Self-pay

## 2019-08-06 ENCOUNTER — Encounter: Payer: Self-pay | Admitting: Family Medicine

## 2019-08-06 VITALS — BP 128/60 | HR 67 | Temp 95.9°F | Resp 18 | Ht 76.0 in | Wt 243.0 lb

## 2019-08-06 DIAGNOSIS — B91 Sequelae of poliomyelitis: Secondary | ICD-10-CM

## 2019-08-06 DIAGNOSIS — E785 Hyperlipidemia, unspecified: Secondary | ICD-10-CM | POA: Diagnosis not present

## 2019-08-06 DIAGNOSIS — R972 Elevated prostate specific antigen [PSA]: Secondary | ICD-10-CM | POA: Diagnosis not present

## 2019-08-06 DIAGNOSIS — I10 Essential (primary) hypertension: Secondary | ICD-10-CM | POA: Diagnosis not present

## 2019-08-06 DIAGNOSIS — R7309 Other abnormal glucose: Secondary | ICD-10-CM | POA: Diagnosis not present

## 2019-08-06 DIAGNOSIS — Z0000 Encounter for general adult medical examination without abnormal findings: Secondary | ICD-10-CM

## 2019-08-06 DIAGNOSIS — M6281 Muscle weakness (generalized): Secondary | ICD-10-CM | POA: Diagnosis not present

## 2019-08-06 LAB — COMPREHENSIVE METABOLIC PANEL
ALT: 13 U/L (ref 0–53)
AST: 19 U/L (ref 0–37)
Albumin: 4.2 g/dL (ref 3.5–5.2)
Alkaline Phosphatase: 77 U/L (ref 39–117)
BUN: 14 mg/dL (ref 6–23)
CO2: 27 mEq/L (ref 19–32)
Calcium: 9 mg/dL (ref 8.4–10.5)
Chloride: 103 mEq/L (ref 96–112)
Creatinine, Ser: 0.86 mg/dL (ref 0.40–1.50)
GFR: 85.11 mL/min (ref >60.00)
Glucose, Bld: 107 mg/dL — ABNORMAL HIGH (ref 70–99)
Potassium: 4.7 mEq/L (ref 3.5–5.1)
Sodium: 139 mEq/L (ref 135–145)
Total Bilirubin: 1 mg/dL (ref 0.2–1.2)
Total Protein: 6.7 g/dL (ref 6.0–8.3)

## 2019-08-06 LAB — LIPID PANEL
Cholesterol: 183 mg/dL (ref 0–200)
HDL: 83.7 mg/dL (ref >39.00)
LDL Cholesterol: 91 mg/dL (ref 0–99)
NonHDL: 99.68
Total CHOL/HDL Ratio: 2
Triglycerides: 43 mg/dL (ref 0.0–149.0)
VLDL: 8.6 mg/dL (ref 0.0–40.0)

## 2019-08-06 LAB — HEMOGLOBIN A1C: Hgb A1c MFr Bld: 5.3 % (ref 4.6–6.5)

## 2019-08-06 LAB — CBC
HCT: 40.5 % (ref 39.0–52.0)
Hemoglobin: 13.8 g/dL (ref 13.0–17.0)
MCHC: 34 g/dL (ref 30.0–36.0)
MCV: 104.7 fl — ABNORMAL HIGH (ref 78.0–100.0)
Platelets: 155 10*3/uL (ref 150.0–400.0)
RBC: 3.87 Mil/uL — ABNORMAL LOW (ref 4.22–5.81)
RDW: 12.7 % (ref 11.5–15.5)
WBC: 5.5 10*3/uL (ref 4.0–10.5)

## 2019-08-06 LAB — PSA: PSA: 0.89 ng/mL (ref 0.10–4.00)

## 2019-08-06 MED ORDER — DOXAZOSIN MESYLATE 8 MG PO TABS: ORAL_TABLET | ORAL | 3 refills | Status: DC

## 2019-08-06 MED ORDER — LISINOPRIL 20 MG PO TABS: 20.0000 mg | ORAL_TABLET | Freq: Every day | ORAL | 3 refills | Status: DC

## 2019-10-26 DIAGNOSIS — H35033 Hypertensive retinopathy, bilateral: Secondary | ICD-10-CM | POA: Diagnosis not present

## 2019-10-26 DIAGNOSIS — Z961 Presence of intraocular lens: Secondary | ICD-10-CM | POA: Diagnosis not present

## 2019-10-26 DIAGNOSIS — H353131 Nonexudative age-related macular degeneration, bilateral, early dry stage: Secondary | ICD-10-CM | POA: Diagnosis not present

## 2019-10-26 DIAGNOSIS — H40013 Open angle with borderline findings, low risk, bilateral: Secondary | ICD-10-CM | POA: Diagnosis not present

## 2019-12-13 NOTE — Progress Notes (Addendum)
Nurse connected with patient 12/14/19 at  1:00 PM EDT by a telephone enabled telemedicine application and verified that I am speaking with the correct person using two identifiers. Patient stated full name and DOB. Patient gave permission to continue with virtual visit. Patient's location was at home and Nurse's location was at [place] office.   Subjective:   Collin Rose is a 85 y.o. male who presents for Medicare Annual/Subsequent preventive examination.  Reads the news paper daily and tries crossword puzzle daily. Also still push mows his yard.  Review of Systems:  Home Safety/Smoke Alarms: Feels safe in home. Smoke alarms in place.  Lives w/ wife in 1 story home.   Male:   PSA-  Lab Results  Component Value Date   PSA 0.89 08/06/2019   PSA 0.91 08/03/2018   PSA 1.76 05/13/2017       Objective:    Vitals: Unable to assess. This visit is enabled though telemedicine due to Covid 19.   Advanced Directives 12/14/2019 08/14/2016 11/06/2014  Does Patient Have a Medical Advance Directive? No No No  Would patient like information on creating a medical advance directive? No - Patient declined - No - patient declined information    Tobacco Social History   Tobacco Use  Smoking Status Former Smoker  . Packs/day: 1.00  . Types: Cigarettes  Smokeless Tobacco Never Used     Counseling given: Not Answered   Clinical Intake: Pain : No/denies pain     Past Medical History:  Diagnosis Date  . Colon polyps   . Heart murmur   . Hypertension    Past Surgical History:  Procedure Laterality Date  . BACK SURGERY    . SPINE SURGERY     Family History  Problem Relation Age of Onset  . Cancer Mother   . Stroke Father    Social History   Socioeconomic History  . Marital status: Married    Spouse name: Not on file  . Number of children: Not on file  . Years of education: Not on file  . Highest education level: Not on file  Occupational History  . Not on file    Tobacco Use  . Smoking status: Former Smoker    Packs/day: 1.00    Types: Cigarettes  . Smokeless tobacco: Never Used  Substance and Sexual Activity  . Alcohol use: Yes  . Drug use: Not on file  . Sexual activity: Not on file  Other Topics Concern  . Not on file  Social History Narrative  . Not on file   Social Determinants of Health   Financial Resource Strain: Low Risk   . Difficulty of Paying Living Expenses: Not hard at all  Food Insecurity: No Food Insecurity  . Worried About Programme researcher, broadcasting/film/video in the Last Year: Never true  . Ran Out of Food in the Last Year: Never true  Transportation Needs: No Transportation Needs  . Lack of Transportation (Medical): No  . Lack of Transportation (Non-Medical): No  Physical Activity:   . Days of Exercise per Week:   . Minutes of Exercise per Session:   Stress:   . Feeling of Stress :   Social Connections:   . Frequency of Communication with Friends and Family:   . Frequency of Social Gatherings with Friends and Family:   . Attends Religious Services:   . Active Member of Clubs or Organizations:   . Attends Banker Meetings:   Marland Kitchen Marital Status:  Outpatient Encounter Medications as of 12/14/2019  Medication Sig  . b complex vitamins capsule Take 1 capsule by mouth daily.  Marland Kitchen doxazosin (CARDURA) 8 MG tablet TAKE 1 BY MOUTH AT BEDTIME  . lisinopril (ZESTRIL) 20 MG tablet Take 1 tablet (20 mg total) by mouth daily.   No facility-administered encounter medications on file as of 12/14/2019.    Activities of Daily Living In your present state of health, do you have any difficulty performing the following activities: 12/14/2019  Hearing? N  Vision? N  Difficulty concentrating or making decisions? N  Walking or climbing stairs? N  Dressing or bathing? N  Doing errands, shopping? N  Preparing Food and eating ? N  Using the Toilet? N  In the past six months, have you accidently leaked urine? N  Do you have problems  with loss of bowel control? N  Managing your Medications? N  Managing your Finances? N  Housekeeping or managing your Housekeeping? N  Some recent data might be hidden    Patient Care Team: Copland, Gay Filler, MD as PCP - General (Family Medicine) Sydnee Levans, MD as Consulting Physician (Dermatology) Karl Luke, MD as Consulting Physician (Optometry)   Assessment:   This is a routine wellness examination for [facility]. Physical assessment deferred to PCP.   Exercise Activities and Dietary recommendations Current Exercise Habits: The patient does not participate in regular exercise at present, Exercise limited by: None identified Diet (meal preparation, eat out, water intake, caffeinated beverages, dairy products, fruits and vegetables): well balanced   Goals    . Increase physical activity       Fall Risk Fall Risk  12/14/2019 08/06/2019 08/03/2018 10/05/2017 02/19/2016  Falls in the past year? 0 0 0 No No  Number falls in past yr: 0 - - - -  Injury with Fall? 0 - - - -  Follow up Education provided;Falls prevention discussed - - - -   Depression Screen PHQ 2/9 Scores 12/14/2019 08/06/2019 08/03/2018 10/05/2017  PHQ - 2 Score 0 0 0 0    Cognitive Function Ad8 score reviewed for issues:  Issues making decisions:no  Less interest in hobbies / activities:no  Repeats questions, stories (family complaining):no  Trouble using ordinary gadgets (microwave, computer, phone): no  Forgets the month or year: no  Mismanaging finances: no  Remembering appts:no  Daily problems with thinking and/or memory:no Ad8 score is=0        Immunization History  Administered Date(s) Administered  . Fluad Quad(high Dose 65+) 05/15/2019  . Influenza Split 05/26/2012  . Influenza, High Dose Seasonal PF 05/27/2017, 05/24/2018  . Influenza,inj,Quad PF,6+ Mos 05/23/2013, 05/23/2014, 05/26/2015, 05/26/2016  . Pneumococcal Conjugate-13 02/14/2014  . Pneumococcal-Unspecified 10/22/2007   . Td 10/22/2007  . Zoster 08/23/2013   Screening Tests Health Maintenance  Topic Date Due  . COVID-19 Vaccine (1) Never done  . TETANUS/TDAP  10/21/2017  . INFLUENZA VACCINE  03/23/2020  . COLONOSCOPY  12/20/2021  . PNA vac Low Risk Adult  Completed       Plan:    Please schedule your next medicare wellness visit with me in 1 yr.  Continue to eat heart healthy diet (full of fruits, vegetables, whole grains, lean protein, water--limit salt, fat, and sugar intake) and increase physical activity as tolerated.  Continue doing brain stimulating activities (puzzles, reading, adult coloring books, staying active) to keep memory sharp.     I have personally reviewed and noted the following in the patient's chart:   . Medical and  social history . Use of alcohol, tobacco or illicit drugs  . Current medications and supplements . Functional ability and status . Nutritional status . Physical activity . Advanced directives . List of other physicians . Hospitalizations, surgeries, and ER visits in previous 12 months . Vitals . Screenings to include cognitive, depression, and falls . Referrals and appointments  In addition, I have reviewed and discussed with patient certain preventive protocols, quality metrics, and best practice recommendations. A written personalized care plan for preventive services as well as general preventive health recommendations were provided to patient.     [place]on Gully, [place]  12/14/2019  Reviewed Donato Schultz, DO

## 2019-12-14 ENCOUNTER — Ambulatory Visit (INDEPENDENT_AMBULATORY_CARE_PROVIDER_SITE_OTHER): Payer: Medicare Other | Admitting: *Deleted

## 2019-12-14 ENCOUNTER — Encounter: Payer: Self-pay | Admitting: *Deleted

## 2019-12-14 ENCOUNTER — Other Ambulatory Visit: Payer: Self-pay

## 2019-12-14 DIAGNOSIS — Z0000 Encounter for general adult medical examination without abnormal findings: Secondary | ICD-10-CM

## 2019-12-14 NOTE — Patient Instructions (Signed)
Please schedule your next medicare wellness visit with me in 1 yr.  Continue to eat heart healthy diet (full of fruits, vegetables, whole grains, lean protein, water--limit salt, fat, and sugar intake) and increase physical activity as tolerated.  Continue doing brain stimulating activities (puzzles, reading, adult coloring books, staying active) to keep memory sharp.     Collin Rose , Thank you for taking time to come for your Medicare Wellness Visit. I appreciate your ongoing commitment to your health goals. Please review the following plan we discussed and let me know if I can assist you in the future.   These are the goals we discussed: Goals    . Increase physical activity       This is a list of the screening recommended for you and due dates:  Health Maintenance  Topic Date Due  . COVID-19 Vaccine (1) Never done  . Tetanus Vaccine  10/21/2017  . Flu Shot  03/23/2020  . Colon Cancer Screening  12/20/2021  . Pneumonia vaccines  Completed    Preventive Care 36 Years and Older, Male Preventive care refers to lifestyle choices and visits with your health care provider that can promote health and wellness. This includes:  A yearly physical exam. This is also called an annual well check.  Regular dental and eye exams.  Immunizations.  Screening for certain conditions.  Healthy lifestyle choices, such as diet and exercise. What can I expect for my preventive care visit? Physical exam Your health care provider will check:  Height and weight. These may be used to calculate body mass index (BMI), which is a measurement that tells if you are at a healthy weight.  Heart rate and blood pressure.  Your skin for abnormal spots. Counseling Your health care provider may ask you questions about:  Alcohol, tobacco, and drug use.  Emotional well-being.  Home and relationship well-being.  Sexual activity.  Eating habits.  History of falls.  Memory and ability to  understand (cognition).  Work and work Statistician. What immunizations do I need?  Influenza (flu) vaccine  This is recommended every year. Tetanus, diphtheria, and pertussis (Tdap) vaccine  You may need a Td booster every 10 years. Varicella (chickenpox) vaccine  You may need this vaccine if you have not already been vaccinated. Zoster (shingles) vaccine  You may need this after age 69. Pneumococcal conjugate (PCV13) vaccine  One dose is recommended after age 56. Pneumococcal polysaccharide (PPSV23) vaccine  One dose is recommended after age 28. Measles, mumps, and rubella (MMR) vaccine  You may need at least one dose of MMR if you were born in 1957 or later. You may also need a second dose. Meningococcal conjugate (MenACWY) vaccine  You may need this if you have certain conditions. Hepatitis A vaccine  You may need this if you have certain conditions or if you travel or work in places where you may be exposed to hepatitis A. Hepatitis B vaccine  You may need this if you have certain conditions or if you travel or work in places where you may be exposed to hepatitis B. Haemophilus influenzae type b (Hib) vaccine  You may need this if you have certain conditions. You may receive vaccines as individual doses or as more than one vaccine together in one shot (combination vaccines). Talk with your health care provider about the risks and benefits of combination vaccines. What tests do I need? Blood tests  Lipid and cholesterol levels. These may be checked every 5 years, or  more frequently depending on your overall health.  Hepatitis C test.  Hepatitis B test. Screening  Lung cancer screening. You may have this screening every year starting at age 92 if you have a 30-pack-year history of smoking and currently smoke or have quit within the past 15 years.  Colorectal cancer screening. All adults should have this screening starting at age 84 and continuing until age 53.  Your health care provider may recommend screening at age 47 if you are at increased risk. You will have tests every 1-10 years, depending on your results and the type of screening test.  Prostate cancer screening. Recommendations will vary depending on your family history and other risks.  Diabetes screening. This is done by checking your blood sugar (glucose) after you have not eaten for a while (fasting). You may have this done every 1-3 years.  Abdominal aortic aneurysm (AAA) screening. You may need this if you are a current or former smoker.  Sexually transmitted disease (STD) testing. Follow these instructions at home: Eating and drinking  Eat a diet that includes fresh fruits and vegetables, whole grains, lean protein, and low-fat dairy products. Limit your intake of foods with high amounts of sugar, saturated fats, and salt.  Take vitamin and mineral supplements as recommended by your health care provider.  Do not drink alcohol if your health care provider tells you not to drink.  If you drink alcohol: ? Limit how much you have to 0-2 drinks a day. ? Be aware of how much alcohol is in your drink. In the [place] one drink equals one 12 oz bottle of beer (355 mL), one 5 oz glass of wine (148 mL), or one 1 oz glass of hard liquor (44 mL). Lifestyle  Take daily care of your teeth and gums.  Stay active. Exercise for at least 30 minutes on 5 or more days each week.  Do not use any products that contain nicotine or tobacco, such as cigarettes, e-cigarettes, and chewing tobacco. If you need help quitting, ask your health care provider.  If you are sexually active, practice safe sex. Use a condom or other form of protection to prevent STIs (sexually transmitted infections).  Talk with your health care provider about taking a low-dose aspirin or statin. What's next?  Visit your health care provider once a year for a well check visit.  Ask your health care provider how often you  should have your eyes and teeth checked.  Stay up to date on all vaccines. This information is not intended to replace advice given to you by your health care provider. Make sure you discuss any questions you have with your health care provider. Document Revised: 08/03/2018 Document Reviewed: 08/03/2018 Elsevier Patient Education  2020 [employer].

## 2020-02-17 NOTE — Progress Notes (Addendum)
[facility] [place], [place], [place] [address] [phone]  Date:  02/20/2020   Name:  Collin Rose   DOB:  November 17, 1934   MRN:  [id number]  PCP:  Darreld Mclean, MD    Chief Complaint: Hypertension (6 months)   History of Present Illness:  Collin Rose is a 85 y.o. very pleasant male patient who presents with the following:  Patient here today for 37-month follow-up visit History of prediabetes, bradycardia, hypertension, history of polio with resultant muscle weakness and neuropathy  Last seen by myself in December 2020 for physical exam  COVID-19 vaccine done- JandJ Patient reports Shingrix completed Most recent labs done in December, looked fine  Married to Collin Rose  He is taking a B vitamin Complex for elevated MCV at last labs  He is feeling well, no concerns He will check his BP at home on occasion  He quit smoking 50 - 60 years ago  Patient Active Problem List   Diagnosis Date Noted  . Pre-diabetes 09/01/2016  . Bradycardia 04/09/2015  . Post-polio muscle weakness 02/14/2014  . Hypertension 12/13/2011  . GERD (gastroesophageal reflux disease) 12/13/2011  . Neuropathy 12/13/2011    Past Medical History:  Diagnosis Date  . Colon polyps   . Heart murmur   . Hypertension     Past Surgical History:  Procedure Laterality Date  . BACK SURGERY    . SPINE SURGERY      Social History   Tobacco Use  . Smoking status: Former Smoker    Packs/day: 1.00    Types: Cigarettes  . Smokeless tobacco: Never Used  Substance Use Topics  . Alcohol use: Yes  . Drug use: Not on file    Family History  Problem Relation Age of Onset  . Cancer Mother   . Stroke Father     No Known Allergies  Medication list has been reviewed and updated.  Current Outpatient Medications on File Prior to Visit  Medication Sig Dispense Refill  . b complex vitamins capsule Take 1 capsule  by mouth daily.    Marland Kitchen doxazosin (CARDURA) 8 MG tablet TAKE 1 BY MOUTH AT BEDTIME 90 tablet 3  . Rose (ZESTRIL) 20 MG tablet Take 1 tablet (20 mg total) by mouth daily. 90 tablet 3   No current facility-administered medications on file prior to visit.    Review of Systems:  As per HPI- otherwise negative.   Physical Examination: Vitals:   02/20/20 0928  BP: 140/72  Pulse: 65  Resp: 16  SpO2: 96%   Vitals:   02/20/20 0928  Weight: 246 lb (111.6 kg)  Height: 6\' 4"  (1.93 m)   Body mass index is 29.94 kg/m. Ideal Body Weight: Weight in (lb) to have BMI = 25: 205  GEN: no acute distress.  Tall build, looks well and his normal self  HEENT: Atraumatic, Normocephalic.  Ears and Nose: No external deformity. CV: RRR, No M/G/R. No JVD. No thrill. No extra heart sounds. PULM: CTA B, no wheezes, crackles, rhonchi. No retractions. No resp. distress. No accessory muscle use. ABD: S, NT, ND, +BS. No rebound. No HSM. EXTR: No c/c/e PSYCH: Normally interactive. Conversant.    Assessment and Plan: Macrocytosis without anemia - Plan: CBC, B12 and Folate Panel, TSH  Essential hypertension - Plan: CBC, Comprehensive metabolic panel  Here today for follow-up visit.  1 was noted to have macrocytosis on his last  CBC, will recheck this as well as a B12 and folate, thyroid today Blood pressures under good control, continue current dose of Rose.  Check CBC and CMP Will plan further follow- up pending labs.  This visit occurred during the SARS-CoV-2 public health emergency.  Safety protocols were in place, including screening questions prior to the visit, additional usage of staff PPE, and extensive cleaning of exam room while observing appropriate contact time as indicated for disinfecting solutions.    Signed Abbe Amsterdam, MD  Received his labs as below, letter to patient  Results for orders placed or performed in visit on 02/20/20  CBC  Result Value Ref Range   WBC 5.4  4.0 - 10.5 K/uL   RBC 3.90 (L) 4.22 - 5.81 Mil/uL   Platelets 140.0 (L) 150 - 400 K/uL   Hemoglobin 13.9 13.0 - 17.0 g/dL   HCT 02.6 39 - 52 %   MCV 105.0 (H) 78.0 - 100.0 fl   MCHC 34.0 30.0 - 36.0 g/dL   RDW 37.8 58.8 - 50.2 %  Comprehensive metabolic panel  Result Value Ref Range   Sodium 139 135 - 145 mEq/L   Potassium 4.6 3.5 - 5.1 mEq/L   Chloride 104 96 - 112 mEq/L   CO2 27 19 - 32 mEq/L   Glucose, Bld 103 (H) 70 - 99 mg/dL   BUN 16 6 - 23 mg/dL   Creatinine, Ser 7.74 0.40 - 1.50 mg/dL   Total Bilirubin 0.8 0.2 - 1.2 mg/dL   Alkaline Phosphatase 71 39 - 117 U/L   AST 16 0 - 37 U/L   ALT 12 0 - 53 U/L   Total Protein 6.6 6.0 - 8.3 g/dL   Albumin 4.1 3.5 - 5.2 g/dL   GFR 12.87 >86.76 mL/min   Calcium 8.9 8.4 - 10.5 mg/dL  H20 and Folate Panel  Result Value Ref Range   Vitamin B-12 234 211 - 911 pg/mL   Folate >24.8 >5.9 ng/mL  TSH  Result Value Ref Range   TSH 1.27 0.35 - 4.50 uIU/mL

## 2020-02-17 NOTE — Patient Instructions (Addendum)
It was great to see you again today- I will be in touch with your labs Please see me in 6 months for your physical and take care!   Health Maintenance After Age 83 After age 66, you are at a higher risk for certain long-term diseases and infections as well as injuries from falls. Falls are a major cause of broken bones and head injuries in people who are older than age 35. Getting regular preventive care can help to keep you healthy and well. Preventive care includes getting regular testing and making lifestyle changes as recommended by your health care provider. Talk with your health care provider about:  Which screenings and tests you should have. A screening is a test that checks for a disease when you have no symptoms.  A diet and exercise plan that is right for you. What should I know about screenings and tests to prevent falls? Screening and testing are the best ways to find a health problem early. Early diagnosis and treatment give you the best chance of managing medical conditions that are common after age 46. Certain conditions and lifestyle choices may make you more likely to have a fall. Your health care provider may recommend:  Regular vision checks. Poor vision and conditions such as cataracts can make you more likely to have a fall. If you wear glasses, make sure to get your prescription updated if your vision changes.  Medicine review. Work with your health care provider to regularly review all of the medicines you are taking, including over-the-counter medicines. Ask your health care provider about any side effects that may make you more likely to have a fall. Tell your health care provider if any medicines that you take make you feel dizzy or sleepy.  Osteoporosis screening. Osteoporosis is a condition that causes the bones to get weaker. This can make the bones weak and cause them to break more easily.  Blood pressure screening. Blood pressure changes and medicines to control  blood pressure can make you feel dizzy.  Strength and balance checks. Your health care provider may recommend certain tests to check your strength and balance while standing, walking, or changing positions.  Foot health exam. Foot pain and numbness, as well as not wearing proper footwear, can make you more likely to have a fall.  Depression screening. You may be more likely to have a fall if you have a fear of falling, feel emotionally low, or feel unable to do activities that you used to do.  Alcohol use screening. Using too much alcohol can affect your balance and may make you more likely to have a fall. What actions can I take to lower my risk of falls? General instructions  Talk with your health care provider about your risks for falling. Tell your health care provider if: ? You fall. Be sure to tell your health care provider about all falls, even ones that seem minor. ? You feel dizzy, sleepy, or off-balance.  Take over-the-counter and prescription medicines only as told by your health care provider. These include any supplements.  Eat a healthy diet and maintain a healthy weight. A healthy diet includes low-fat dairy products, low-fat (lean) meats, and fiber from whole grains, beans, and lots of fruits and vegetables. Home safety  Remove any tripping hazards, such as rugs, cords, and clutter.  Install safety equipment such as grab bars in bathrooms and safety rails on stairs.  Keep rooms and walkways well-lit. Activity   Follow a regular exercise program  to stay fit. This will help you maintain your balance. Ask your health care provider what types of exercise are appropriate for you.  If you need a cane or walker, use it as recommended by your health care provider.  Wear supportive shoes that have nonskid soles. Lifestyle  Do not drink alcohol if your health care provider tells you not to drink.  If you drink alcohol, limit how much you have: ? 0-1 drink a day for  women. ? 0-2 drinks a day for men.  Be aware of how much alcohol is in your drink. In the [place] one drink equals one typical bottle of beer (12 oz), one-half glass of wine (5 oz), or one shot of hard liquor (1 oz).  Do not use any products that contain nicotine or tobacco, such as cigarettes and e-cigarettes. If you need help quitting, ask your health care provider. Summary  Having a healthy lifestyle and getting preventive care can help to protect your health and wellness after age 63.  Screening and testing are the best way to find a health problem early and help you avoid having a fall. Early diagnosis and treatment give you the best chance for managing medical conditions that are more common for people who are older than age 56.  Falls are a major cause of broken bones and head injuries in people who are older than age 55. Take precautions to prevent a fall at home.  Work with your health care provider to learn what changes you can make to improve your health and wellness and to prevent falls. This information is not intended to replace advice given to you by your health care provider. Make sure you discuss any questions you have with your health care provider. Document Revised: 11/30/2018 Document Reviewed: 06/22/2017 Elsevier Patient Education  2020 [employer].

## 2020-02-20 ENCOUNTER — Other Ambulatory Visit: Payer: Self-pay

## 2020-02-20 ENCOUNTER — Encounter: Payer: Self-pay | Admitting: Family Medicine

## 2020-02-20 ENCOUNTER — Ambulatory Visit: Payer: Medicare Other | Admitting: Family Medicine

## 2020-02-20 VITALS — BP 140/72 | HR 65 | Resp 16 | Ht 76.0 in | Wt 246.0 lb

## 2020-02-20 DIAGNOSIS — D7589 Other specified diseases of blood and blood-forming organs: Secondary | ICD-10-CM | POA: Diagnosis not present

## 2020-02-20 DIAGNOSIS — I10 Essential (primary) hypertension: Secondary | ICD-10-CM | POA: Diagnosis not present

## 2020-02-20 LAB — B12 AND FOLATE PANEL
Folate: >24.8 ng/mL (ref >5.9)
Vitamin B-12: 234 pg/mL (ref 211–911)

## 2020-02-20 LAB — COMPREHENSIVE METABOLIC PANEL
ALT: 12 U/L (ref 0–53)
AST: 16 U/L (ref 0–37)
Albumin: 4.1 g/dL (ref 3.5–5.2)
Alkaline Phosphatase: 71 U/L (ref 39–117)
BUN: 16 mg/dL (ref 6–23)
CO2: 27 mEq/L (ref 19–32)
Calcium: 8.9 mg/dL (ref 8.4–10.5)
Chloride: 104 mEq/L (ref 96–112)
Creatinine, Ser: 0.91 mg/dL (ref 0.40–1.50)
GFR: 79.63 mL/min (ref >60.00)
Glucose, Bld: 103 mg/dL — ABNORMAL HIGH (ref 70–99)
Potassium: 4.6 mEq/L (ref 3.5–5.1)
Sodium: 139 mEq/L (ref 135–145)
Total Bilirubin: 0.8 mg/dL (ref 0.2–1.2)
Total Protein: 6.6 g/dL (ref 6.0–8.3)

## 2020-02-20 LAB — CBC
HCT: 41 % (ref 39.0–52.0)
Hemoglobin: 13.9 g/dL (ref 13.0–17.0)
MCHC: 34 g/dL (ref 30.0–36.0)
MCV: 105 fl — ABNORMAL HIGH (ref 78.0–100.0)
Platelets: 140 10*3/uL — ABNORMAL LOW (ref 150.0–400.0)
RBC: 3.9 Mil/uL — ABNORMAL LOW (ref 4.22–5.81)
RDW: 13.5 % (ref 11.5–15.5)
WBC: 5.4 10*3/uL (ref 4.0–10.5)

## 2020-02-20 LAB — TSH: TSH: 1.27 u[IU]/mL (ref 0.35–4.50)

## 2020-06-02 ENCOUNTER — Other Ambulatory Visit: Payer: Self-pay

## 2020-06-02 ENCOUNTER — Ambulatory Visit (INDEPENDENT_AMBULATORY_CARE_PROVIDER_SITE_OTHER): Payer: Medicare Other

## 2020-06-02 DIAGNOSIS — Z23 Encounter for immunization: Secondary | ICD-10-CM

## 2020-08-03 NOTE — Progress Notes (Addendum)
[facility] [address], [address] [place], [place] [address] [phone]  Date:  08/06/2020   Name:  STEPHANIE MCGLONE   DOB:  [birth year]   MRN:  [id number]  PCP:  Pearline Cables, MD    Chief Complaint: Annual Exam   History of Present Illness:  Collin Rose is a 85 y.o. very pleasant male patient who presents with the following:  Patient seen today for physical exam Last visit with myself in June- History of prediabetes, bradycardia, hypertension, history of polio with resultant muscle weakness and neuropathy  Shingrix complete Flu shot done COVID-19 booster Labs done in June, could use recheck today We discussed PSA testing- at this point pt would like to DC testing, which is fine with me and appropratie  We discussed his weight today- he notes that he is trying to stay under 250 lbs and wonder is this is ok.  I advised that at his age a few extra lbs are ok and I agree with this weight goal He is feeling well - is still quite active No CP or SOB Really no concerns today except his wife Harriett Sine  Patient Active Problem List   Diagnosis Date Noted  . Pre-diabetes 09/01/2016  . Bradycardia 04/09/2015  . Post-polio muscle weakness 02/14/2014  . Hypertension 12/13/2011  . GERD (gastroesophageal reflux disease) 12/13/2011  . Neuropathy 12/13/2011    Past Medical History:  Diagnosis Date  . Colon polyps   . Heart murmur   . Hypertension     Past Surgical History:  Procedure Laterality Date  . BACK SURGERY    . SPINE SURGERY      Social History   Tobacco Use  . Smoking status: Former Smoker    Packs/day: 1.00    Types: Cigarettes  . Smokeless tobacco: Never Used  Substance Use Topics  . Alcohol use: Yes    Family History  Problem Relation Age of Onset  . Cancer Mother   . Stroke Father     No Known Allergies  Medication list has been reviewed and updated.  Current Outpatient Medications on File  Prior to Visit  Medication Sig Dispense Refill  . b complex vitamins capsule Take 1 capsule by mouth daily.     No current facility-administered medications on file prior to visit.    Review of Systems:  As per HPI- otherwise negative.   Physical Examination: Vitals:   08/06/20 0942  BP: (!) 142/68  Pulse: 64  Resp: 17  SpO2: 98%   Vitals:   08/06/20 0942  Weight: 241 lb (109.3 kg)  Height: 6\' 4"  (1.93 m)   Body mass index is 29.34 kg/m. Ideal Body Weight: Weight in (lb) to have BMI = 25: 205  GEN: no acute distress.  Elderly man looks well HEENT: Atraumatic, Normocephalic.  Ears and Nose: No external deformity. CV: RRR, No M/G/R. No JVD. No thrill. No extra heart sounds. PULM: CTA B, no wheezes, crackles, rhonchi. No retractions. No resp. distress. No accessory muscle use. ABD: S, NT, ND, +BS. No rebound. No HSM. EXTR: No c/c/e PSYCH: Normally interactive. Conversant.   Pt reports weight is 238 at home  BP Readings from Last 3 Encounters:  08/06/20 (!) 142/68  02/20/20 140/72  08/06/19 128/60   Wt Readings from Last 3 Encounters:  08/06/20 241 lb (109.3 kg)  02/20/20 246 lb (111.6 kg)  08/06/19 243 lb (110.2 kg)    Assessment and  Plan: Physical exam  Macrocytosis without anemia - Plan: Vitamin B12  Essential hypertension - Plan: CBC, Basic metabolic panel, lisinopril (ZESTRIL) 20 MG tablet, doxazosin (CARDURA) 8 MG tablet  Post-polio limb muscle weakness  Hyperlipidemia, unspecified hyperlipidemia type - Plan: Lipid panel  B12 deficiency - Plan: Vitamin B12   Patient here today for follow-up visit/physical exam  Blood pressure is under reasonable control, continue current medication Stable post polio weakness Cholesterol, B12 pending as above We discussed his weight.  He is overweight, other for his age a few extra pounds acceptable-I think current weight is okay Encouraged continued physical activity and healthy diet Will plan further follow-  up pending labs.  This visit occurred during the SARS-CoV-2 public health emergency.  Safety protocols were in place, including screening questions prior to the visit, additional usage of staff PPE, and extensive cleaning of exam room while observing appropriate contact time as indicated for disinfecting solutions.    Signed Abbe Amsterdam, MD  Received his labs as below, letter to patient  Results for orders placed or performed in visit on 08/06/20  CBC  Result Value Ref Range   WBC 5.2 4.0 - 10.5 K/uL   RBC 4.00 (L) 4.22 - 5.81 Mil/uL   Platelets 142.0 (L) 150.0 - 400.0 K/uL   Hemoglobin 13.6 13.0 - 17.0 g/dL   HCT 81.4 48.1 - 85.6 %   MCV 102.2 (H) 78.0 - 100.0 fl   MCHC 33.1 30.0 - 36.0 g/dL   RDW 31.4 97.0 - 26.3 %  Basic metabolic panel  Result Value Ref Range   Sodium 140 135 - 145 mEq/L   Potassium 4.9 3.5 - 5.1 mEq/L   Chloride 105 96 - 112 mEq/L   CO2 28 19 - 32 mEq/L   Glucose, Bld 98 70 - 99 mg/dL   BUN 15 6 - 23 mg/dL   Creatinine, Ser 7.85 0.40 - 1.50 mg/dL   GFR 88.50 >27.74 mL/min   Calcium 8.9 8.4 - 10.5 mg/dL  Lipid panel  Result Value Ref Range   Cholesterol 144 0 - 200 mg/dL   Triglycerides 12.8 0.0 - 149.0 mg/dL   HDL 78.67 >67.20 mg/dL   VLDL 94.7 0.0 - 09.6 mg/dL   LDL Cholesterol 69 0 - 99 mg/dL   Total CHOL/HDL Ratio 2    NonHDL 83.19   Vitamin B12  Result Value Ref Range   Vitamin B-12 342 211 - 911 pg/mL

## 2020-08-06 ENCOUNTER — Other Ambulatory Visit: Payer: Self-pay

## 2020-08-06 ENCOUNTER — Encounter: Payer: Self-pay | Admitting: Family Medicine

## 2020-08-06 ENCOUNTER — Ambulatory Visit (INDEPENDENT_AMBULATORY_CARE_PROVIDER_SITE_OTHER): Payer: Medicare Other | Admitting: Family Medicine

## 2020-08-06 VITALS — BP 142/68 | HR 64 | Resp 17 | Ht 76.0 in | Wt 241.0 lb

## 2020-08-06 DIAGNOSIS — M6281 Muscle weakness (generalized): Secondary | ICD-10-CM

## 2020-08-06 DIAGNOSIS — Z0000 Encounter for general adult medical examination without abnormal findings: Secondary | ICD-10-CM | POA: Diagnosis not present

## 2020-08-06 DIAGNOSIS — E538 Deficiency of other specified B group vitamins: Secondary | ICD-10-CM | POA: Diagnosis not present

## 2020-08-06 DIAGNOSIS — I10 Essential (primary) hypertension: Secondary | ICD-10-CM

## 2020-08-06 DIAGNOSIS — B91 Sequelae of poliomyelitis: Secondary | ICD-10-CM

## 2020-08-06 DIAGNOSIS — E785 Hyperlipidemia, unspecified: Secondary | ICD-10-CM

## 2020-08-06 DIAGNOSIS — D7589 Other specified diseases of blood and blood-forming organs: Secondary | ICD-10-CM | POA: Diagnosis not present

## 2020-08-06 LAB — CBC
HCT: 40.9 % (ref 39.0–52.0)
Hemoglobin: 13.6 g/dL (ref 13.0–17.0)
MCHC: 33.1 g/dL (ref 30.0–36.0)
MCV: 102.2 fl — ABNORMAL HIGH (ref 78.0–100.0)
Platelets: 142 10*3/uL — ABNORMAL LOW (ref 150.0–400.0)
RBC: 4 Mil/uL — ABNORMAL LOW (ref 4.22–5.81)
RDW: 13.8 % (ref 11.5–15.5)
WBC: 5.2 10*3/uL (ref 4.0–10.5)

## 2020-08-06 LAB — BASIC METABOLIC PANEL
BUN: 15 mg/dL (ref 6–23)
CO2: 28 mEq/L (ref 19–32)
Calcium: 8.9 mg/dL (ref 8.4–10.5)
Chloride: 105 mEq/L (ref 96–112)
Creatinine, Ser: 0.91 mg/dL (ref 0.40–1.50)
GFR: 78.14 mL/min (ref >60.00)
Glucose, Bld: 98 mg/dL (ref 70–99)
Potassium: 4.9 mEq/L (ref 3.5–5.1)
Sodium: 140 mEq/L (ref 135–145)

## 2020-08-06 LAB — LIPID PANEL
Cholesterol: 144 mg/dL (ref 0–200)
HDL: 61 mg/dL (ref >39.00)
LDL Cholesterol: 69 mg/dL (ref 0–99)
NonHDL: 83.19
Total CHOL/HDL Ratio: 2
Triglycerides: 71 mg/dL (ref 0.0–149.0)
VLDL: 14.2 mg/dL (ref 0.0–40.0)

## 2020-08-06 LAB — VITAMIN B12: Vitamin B-12: 342 pg/mL (ref 211–911)

## 2020-08-06 MED ORDER — LISINOPRIL 20 MG PO TABS: 20.0000 mg | ORAL_TABLET | Freq: Every day | ORAL | 3 refills | Status: DC

## 2020-08-06 MED ORDER — DOXAZOSIN MESYLATE 8 MG PO TABS: ORAL_TABLET | ORAL | 3 refills | Status: DC

## 2020-08-06 NOTE — Patient Instructions (Signed)
Good to see you again today!  I will be in touch with your labs asap  Please see me in about 6 months

## 2020-12-15 ENCOUNTER — Ambulatory Visit: Payer: Medicare Other | Admitting: *Deleted

## 2020-12-18 ENCOUNTER — Other Ambulatory Visit: Payer: Self-pay

## 2020-12-18 ENCOUNTER — Ambulatory Visit (INDEPENDENT_AMBULATORY_CARE_PROVIDER_SITE_OTHER): Payer: Medicare Other

## 2020-12-18 VITALS — BP 158/80 | HR 55 | Temp 97.7°F | Resp 16 | Ht 76.0 in | Wt 251.8 lb

## 2020-12-18 DIAGNOSIS — Z0000 Encounter for general adult medical examination without abnormal findings: Secondary | ICD-10-CM | POA: Diagnosis not present

## 2020-12-18 NOTE — Progress Notes (Signed)
Subjective:   Collin Rose is a 86 y.o. male who presents for Medicare Annual/Subsequent preventive examination.  Review of Systems     Cardiac Risk Factors include: advanced age (>66men, >22 women);male gender;hypertension;obesity (BMI >30kg/m2)     Objective:    Today's Vitals   12/18/20 1432  BP: (!) 158/80  Pulse: (!) 55  Resp: 16  Temp: 97.7 F (36.5 C)  TempSrc: Temporal  SpO2: 96%  Weight: 251 lb 12.8 oz (114.2 kg)  Height: 6\' 4"  (1.93 m)   Body mass index is 30.65 kg/m.  Advanced Directives 12/18/2020 12/14/2019 08/14/2016 11/06/2014  Does Patient Have a Medical Advance Directive? No No No No  Would patient like information on creating a medical advance directive? No - Patient declined No - Patient declined - No - patient declined information    Current Medications (verified) Outpatient Encounter Medications as of 12/18/2020  Medication Sig  . b complex vitamins capsule Take 1 capsule by mouth daily.  12/20/2020 doxazosin (CARDURA) 8 MG tablet TAKE 1 BY MOUTH AT BEDTIME  . lisinopril (ZESTRIL) 20 MG tablet Take 1 tablet (20 mg total) by mouth daily.   No facility-administered encounter medications on file as of 12/18/2020.    Allergies (verified) Patient has no known allergies.   History: Past Medical History:  Diagnosis Date  . Colon polyps   . Heart murmur   . Hypertension    Past Surgical History:  Procedure Laterality Date  . BACK SURGERY    . SPINE SURGERY     Family History  Problem Relation Age of Onset  . Cancer Mother   . Stroke Father    Social History   Socioeconomic History  . Marital status: Married    Spouse name: Not on file  . Number of children: Not on file  . Years of education: Not on file  . Highest education level: Not on file  Occupational History  . Occupation: retired  Tobacco Use  . Smoking status: Former Smoker    Packs/day: 1.00    Types: Cigarettes  . Smokeless tobacco: Never Used  Substance and Sexual Activity  .  Alcohol use: Yes  . Drug use: Not on file  . Sexual activity: Not on file  Other Topics Concern  . Not on file  Social History Narrative  . Not on file   Social Determinants of Health   Financial Resource Strain: Low Risk   . Difficulty of Paying Living Expenses: Not hard at all  Food Insecurity: No Food Insecurity  . Worried About 12/20/2020 in the Last Year: Never true  . Ran Out of Food in the Last Year: Never true  Transportation Needs: No Transportation Needs  . Lack of Transportation (Medical): No  . Lack of Transportation (Non-Medical): No  Physical Activity: Inactive  . Days of Exercise per Week: 0 days  . Minutes of Exercise per Session: 0 min  Stress: No Stress Concern Present  . Feeling of Stress : Not at all  Social Connections: Moderately Isolated  . Frequency of Communication with Friends and Family: More than three times a week  . Frequency of Social Gatherings with Friends and Family: More than three times a week  . Attends Religious Services: Never  . Active Member of Clubs or Organizations: No  . Attends Programme researcher, broadcasting/film/video Meetings: Never  . Marital Status: Married    Tobacco Counseling Counseling given: Not Answered   Clinical Intake:  Pre-visit preparation completed: Yes  Pain : No/denies pain     Nutritional Status: BMI > 30  Obese Nutritional Risks: None Diabetes: No  How often do you need to have someone help you when you read instructions, pamphlets, or other written materials from your doctor or pharmacy?: 1 - Never  Diabetic?No  Interpreter Needed?: No  Information entered by :: Thomasenia Sales LPN   Activities of Daily Living In your present state of health, do you have any difficulty performing the following activities: 12/18/2020  Hearing? N  Vision? N  Difficulty concentrating or making decisions? N  Walking or climbing stairs? N  Dressing or bathing? N  Doing errands, shopping? N  Preparing Food and eating ? N   Using the Toilet? N  In the past six months, have you accidently leaked urine? N  Do you have problems with loss of bowel control? N  Managing your Medications? N  Managing your Finances? N  Housekeeping or managing your Housekeeping? N  Some recent data might be hidden    Patient Care Team: Copland, Gwenlyn Found, MD as PCP - General (Family Medicine) Bufford Buttner, MD as Consulting Physician (Dermatology) Tedd Sias, MD as Consulting Physician (Optometry)  Indicate any recent Medical Services you may have received from other than [facility] providers in the past year (date may be approximate).     Assessment:   This is a routine wellness examination for [facility].  Hearing/Vision screen  Hearing Screening   125Hz  250Hz  500Hz  1000Hz  2000Hz  3000Hz  4000Hz  6000Hz  8000Hz   Right ear:           Left ear:           Comments: No issues  Vision Screening Comments: Reading glasses Last eye exam-2021-Dr.  Dietary issues and exercise activities discussed: Current Exercise Habits: The patient does not participate in regular exercise at present (pt states he works in the yard), Exercise limited by: None identified  Goals    . Increase physical activity      Depression Screen PHQ 2/9 Scores 12/18/2020 12/14/2019 08/06/2019 08/03/2018 10/05/2017 02/19/2016 08/21/2015  PHQ - 2 Score 0 0 0 0 0 0 0    Fall Risk Fall Risk  12/18/2020 12/14/2019 08/06/2019 08/03/2018 10/05/2017  Falls in the past year? 0 0 0 0 No  Number falls in past yr: 0 0 - - -  Injury with Fall? 0 0 - - -  Follow up Falls prevention discussed Education provided;Falls prevention discussed - - -    FALL RISK PREVENTION PERTAINING TO THE HOME:  Any stairs in or around the home? No  Home free of loose throw rugs in walkways, pet beds, electrical cords, etc? Yes  Adequate lighting in your home to reduce risk of falls? Yes   ASSISTIVE DEVICES UTILIZED TO PREVENT FALLS:  Life alert? No  Use of a cane, walker or w/c? No   Grab bars in the bathroom? Yes  Shower chair or bench in shower? No  Elevated toilet seat or a handicapped toilet? No   TIMED UP AND GO:  Was the test performed? Yes .  Length of time to ambulate 10 feet: 10 sec.   Gait steady and fast without use of assistive device  Cognitive Function:Normal cognitive status assessed by direct observation by this Nurse Health Advisor. No abnormalities found.          Immunizations Immunization History  Administered Date(s) Administered  . Fluad Quad(high Dose 65+) 05/15/2019, 06/02/2020  . Influenza Split 05/26/2012  . Influenza, High Dose  Seasonal PF 05/27/2017, 05/24/2018  . Influenza,inj,Quad PF,6+ Mos 05/23/2013, 05/23/2014, 05/26/2015, 05/26/2016  . Janssen (J&J) SARS-COV-2 Vaccination 11/26/2019  . PFIZER(Purple Top)SARS-COV-2 Vaccination 07/20/2020  . Pneumococcal Conjugate-13 02/14/2014  . Pneumococcal-Unspecified 10/22/2007  . Td 10/22/2007  . Tdap 08/06/2018  . Zoster 08/23/2013  . Zoster Recombinat (Shingrix) 06/13/2018, 08/12/2018    TDAP status: Up to date  Flu Vaccine status: Up to date  Pneumococcal vaccine status: Up to date  Covid-19 vaccine status: Completed vaccines  Qualifies for Shingles Vaccine? No   Zostavax completed Yes   Shingrix Completed?: Yes  Screening Tests Health Maintenance  Topic Date Due  . INFLUENZA VACCINE  03/23/2021  . COLONOSCOPY (Pts 45-17yrs Insurance coverage will need to be confirmed)  12/20/2021  . TETANUS/TDAP  08/06/2028  . COVID-19 Vaccine  Completed  . PNA vac Low Risk Adult  Completed  . HPV VACCINES  Aged Out    Health Maintenance  There are no preventive care reminders to display for this patient.  Colorectal cancer screening: No longer required.   Lung Cancer Screening: (Low Dose CT Chest recommended if Age 74-80 years, 30 pack-year currently smoking OR have quit w/in 15years.) does not qualify.    Additional Screening:  Hepatitis C Screening: does not  qualify  Vision Screening: Recommended annual ophthalmology exams for early detection of glaucoma and other disorders of the eye. Is the patient up to date with their annual eye exam?  Yes  Who is the provider or what is the name of the office in which the patient attends annual eye exams? Dr. Sherrine Maples   Dental Screening: Recommended annual dental exams for proper oral hygiene  Community Resource Referral / Chronic Care Management: CRR required this visit?  No   CCM required this visit?  No      Plan:     I have personally reviewed and noted the following in the patient's chart:   . Medical and social history . Use of alcohol, tobacco or illicit drugs  . Current medications and supplements . Functional ability and status . Nutritional status . Physical activity . Advanced directives . List of other physicians . Hospitalizations, surgeries, and ER visits in previous 12 months . Vitals . Screenings to include cognitive, depression, and falls . Referrals and appointments  In addition, I have reviewed and discussed with patient certain preventive protocols, quality metrics, and best practice recommendations. A written personalized care plan for preventive services as well as general preventive health recommendations were provided to patient.     Roanna Raider, LPN   [phone]  Nurse Health Advisor  Nurse Notes: None

## 2020-12-18 NOTE — Patient Instructions (Signed)
Collin Rose , Thank you for taking time to come for your Medicare Wellness Visit. I appreciate your ongoing commitment to your health goals. Please review the following plan we discussed and let me know if I can assist you in the future.   Screening recommendations/referrals: Colonoscopy: No longer required Recommended yearly ophthalmology/optometry visit for glaucoma screening and checkup Recommended yearly dental visit for hygiene and checkup  Vaccinations: Influenza vaccine: Up to date Pneumococcal vaccine: Completed vaccines Tdap vaccine: Up to date-Due 08/06/2028 Shingles vaccine: Completed vaccines   Covid-19: Up to date  Advanced directives: Please bring a copy for your chart when available.  Conditions/risks identified: See problem list  Next appointment: Follow up in one year for your annual wellness visit.   Preventive Care 84 Years and Older, Male Preventive care refers to lifestyle choices and visits with your health care provider that can promote health and wellness. What does preventive care include?  A yearly physical exam. This is also called an annual well check.  Dental exams once or twice a year.  Routine eye exams. Ask your health care provider how often you should have your eyes checked.  Personal lifestyle choices, including:  Daily care of your teeth and gums.  Regular physical activity.  Eating a healthy diet.  Avoiding tobacco and drug use.  Limiting alcohol use.  Practicing safe sex.  Taking low doses of aspirin every day.  Taking vitamin and mineral supplements as recommended by your health care provider. What happens during an annual well check? The services and screenings done by your health care provider during your annual well check will depend on your age, overall health, lifestyle risk factors, and family history of disease. Counseling  Your health care provider may ask you questions about your:  Alcohol use.  Tobacco use.  Drug  use.  Emotional well-being.  Home and relationship well-being.  Sexual activity.  Eating habits.  History of falls.  Memory and ability to understand (cognition).  Work and work Astronomer. Screening  You may have the following tests or measurements:  Height, weight, and BMI.  Blood pressure.  Lipid and cholesterol levels. These may be checked every 5 years, or more frequently if you are over 5 years old.  Skin check.  Lung cancer screening. You may have this screening every year starting at age 26 if you have a 30-pack-year history of smoking and currently smoke or have quit within the past 15 years.  Fecal occult blood test (FOBT) of the stool. You may have this test every year starting at age 2.  Flexible sigmoidoscopy or colonoscopy. You may have a sigmoidoscopy every 5 years or a colonoscopy every 10 years starting at age 22.  Prostate cancer screening. Recommendations will vary depending on your family history and other risks.  Hepatitis C blood test.  Hepatitis B blood test.  Sexually transmitted disease (STD) testing.  Diabetes screening. This is done by checking your blood sugar (glucose) after you have not eaten for a while (fasting). You may have this done every 1-3 years.  Abdominal aortic aneurysm (AAA) screening. You may need this if you are a current or former smoker.  Osteoporosis. You may be screened starting at age 58 if you are at high risk. Talk with your health care provider about your test results, treatment options, and if necessary, the need for more tests. Vaccines  Your health care provider may recommend certain vaccines, such as:  Influenza vaccine. This is recommended every year.  Tetanus, diphtheria,  and acellular pertussis (Tdap, Td) vaccine. You may need a Td booster every 10 years.  Zoster vaccine. You may need this after age 81.  Pneumococcal 13-valent conjugate (PCV13) vaccine. One dose is recommended after age  29.  Pneumococcal polysaccharide (PPSV23) vaccine. One dose is recommended after age 35. Talk to your health care provider about which screenings and vaccines you need and how often you need them. This information is not intended to replace advice given to you by your health care provider. Make sure you discuss any questions you have with your health care provider. Document Released: 09/05/2015 Document Revised: 04/28/2016 Document Reviewed: 06/10/2015 Elsevier Interactive Patient Education  2017 [employer] Prevention in the Home Falls can cause injuries. They can happen to people of all ages. There are many things you can do to make your home safe and to help prevent falls. What can I do on the outside of my home?  Regularly fix the edges of walkways and driveways and fix any cracks.  Remove anything that might make you trip as you walk through a door, such as a raised step or threshold.  Trim any bushes or trees on the path to your home.  Use bright outdoor lighting.  Clear any walking paths of anything that might make someone trip, such as rocks or tools.  Regularly check to see if handrails are loose or broken. Make sure that both sides of any steps have handrails.  Any raised decks and porches should have guardrails on the edges.  Have any leaves, snow, or ice cleared regularly.  Use sand or salt on walking paths during winter.  Clean up any spills in your garage right away. This includes oil or grease spills. What can I do in the bathroom?  Use night lights.  Install grab bars by the toilet and in the tub and shower. Do not use towel bars as grab bars.  Use non-skid mats or decals in the tub or shower.  If you need to sit down in the shower, use a plastic, non-slip stool.  Keep the floor dry. Clean up any water that spills on the floor as soon as it happens.  Remove soap buildup in the tub or shower regularly.  Attach bath mats securely with double-sided  non-slip rug tape.  Do not have throw rugs and other things on the floor that can make you trip. What can I do in the bedroom?  Use night lights.  Make sure that you have a light by your bed that is easy to reach.  Do not use any sheets or blankets that are too big for your bed. They should not hang down onto the floor.  Have a firm chair that has side arms. You can use this for support while you get dressed.  Do not have throw rugs and other things on the floor that can make you trip. What can I do in the kitchen?  Clean up any spills right away.  Avoid walking on wet floors.  Keep items that you use a lot in easy-to-reach places.  If you need to reach something above you, use a strong step stool that has a grab bar.  Keep electrical cords out of the way.  Do not use floor polish or wax that makes floors slippery. If you must use wax, use non-skid floor wax.  Do not have throw rugs and other things on the floor that can make you trip. What can I do with my  stairs?  Do not leave any items on the stairs.  Make sure that there are handrails on both sides of the stairs and use them. Fix handrails that are broken or loose. Make sure that handrails are as long as the stairways.  Check any carpeting to make sure that it is firmly attached to the stairs. Fix any carpet that is loose or worn.  Avoid having throw rugs at the top or bottom of the stairs. If you do have throw rugs, attach them to the floor with carpet tape.  Make sure that you have a light switch at the top of the stairs and the bottom of the stairs. If you do not have them, ask someone to add them for you. What else can I do to help prevent falls?  Wear shoes that:  Do not have high heels.  Have rubber bottoms.  Are comfortable and fit you well.  Are closed at the toe. Do not wear sandals.  If you use a stepladder:  Make sure that it is fully opened. Do not climb a closed stepladder.  Make sure that both  sides of the stepladder are locked into place.  Ask someone to hold it for you, if possible.  Clearly mark and make sure that you can see:  Any grab bars or handrails.  First and last steps.  Where the edge of each step is.  Use tools that help you move around (mobility aids) if they are needed. These include:  Canes.  Walkers.  Scooters.  Crutches.  Turn on the lights when you go into a dark area. Replace any light bulbs as soon as they burn out.  Set up your furniture so you have a clear path. Avoid moving your furniture around.  If any of your floors are uneven, fix them.  If there are any pets around you, be aware of where they are.  Review your medicines with your doctor. Some medicines can make you feel dizzy. This can increase your chance of falling. Ask your doctor what other things that you can do to help prevent falls. This information is not intended to replace advice given to you by your health care provider. Make sure you discuss any questions you have with your health care provider. Document Released: 06/05/2009 Document Revised: 01/15/2016 Document Reviewed: 09/13/2014 Elsevier Interactive Patient Education  2017 [employer].

## 2020-12-30 DIAGNOSIS — D225 Melanocytic nevi of trunk: Secondary | ICD-10-CM | POA: Diagnosis not present

## 2020-12-30 DIAGNOSIS — L570 Actinic keratosis: Secondary | ICD-10-CM | POA: Diagnosis not present

## 2020-12-30 DIAGNOSIS — D485 Neoplasm of uncertain behavior of skin: Secondary | ICD-10-CM | POA: Diagnosis not present

## 2020-12-30 DIAGNOSIS — L821 Other seborrheic keratosis: Secondary | ICD-10-CM | POA: Diagnosis not present

## 2021-01-15 DIAGNOSIS — H35373 Puckering of macula, bilateral: Secondary | ICD-10-CM | POA: Diagnosis not present

## 2021-01-15 DIAGNOSIS — H26491 Other secondary cataract, right eye: Secondary | ICD-10-CM | POA: Diagnosis not present

## 2021-01-15 DIAGNOSIS — H40013 Open angle with borderline findings, low risk, bilateral: Secondary | ICD-10-CM | POA: Diagnosis not present

## 2021-01-15 DIAGNOSIS — H353131 Nonexudative age-related macular degeneration, bilateral, early dry stage: Secondary | ICD-10-CM | POA: Diagnosis not present

## 2021-01-29 NOTE — Patient Instructions (Addendum)
It was great to see you again today, I will be in touch with your labs soon as possible.  Assuming you are doing well, please see me in about 6 months

## 2021-01-29 NOTE — Progress Notes (Addendum)
[facility] [address] [place], [place] [address] [phone]  Date:  02/02/2021   Name:  Collin Rose   DOB:  11-05-1934   MRN:  [id number]  PCP:  Pearline Cables, MD    Chief Complaint: Medical Management of Chronic Issues (6 m f/u )   History of Present Illness:  Collin Rose is a 86 y.o. very pleasant male patient who presents with the following:  Patient seen today for periodic follow-up, most recent visit with myself was in December History of prediabetes, bradycardia, hypertension, history of polio with resultant muscle weakness and neuropathy Married to Collin Rose who is also my patient He has had macrocytosis since we first checked his blood work in 2013-B12 and folate have been normal  He did see derm since our last visit and they removed a couple of skin lesions-   COVID-19 second booster- pt states this is done  Shingrix is complete Most recent lab work in December-BMP, lipid, CBC, B12 He no longer wishes to be screened with PSA due to his age which is appropriate  Vivan has no major concerns today, here today for follow-up.  He states he is feeling well and his normal self  Pulse Readings from Last 3 Encounters:  02/02/21 60  12/18/20 (!) 55  08/06/20 64    Patient Active Problem List   Diagnosis Date Noted   Pre-diabetes 09/01/2016   Bradycardia 04/09/2015   Post-polio muscle weakness 02/14/2014   Hypertension 12/13/2011   GERD (gastroesophageal reflux disease) 12/13/2011   Neuropathy 12/13/2011    Past Medical History:  Diagnosis Date   Colon polyps    Heart murmur    Hypertension     Past Surgical History:  Procedure Laterality Date   BACK SURGERY     SPINE SURGERY      Social History   Tobacco Use   Smoking status: Former    Packs/day: 1.00    Pack years: 0.00    Types: Cigarettes   Smokeless tobacco: Never  Substance Use Topics   Alcohol use: Yes    Family History   Problem Relation Age of Onset   Cancer Mother    Stroke Father     No Known Allergies  Medication list has been reviewed and updated.  Current Outpatient Medications on File Prior to Visit  Medication Sig Dispense Refill   b complex vitamins capsule Take 1 capsule by mouth daily.     doxazosin (CARDURA) 8 MG tablet TAKE 1 BY MOUTH AT BEDTIME 90 tablet 3   lisinopril (ZESTRIL) 20 MG tablet Take 1 tablet (20 mg total) by mouth daily. 90 tablet 3   No current facility-administered medications on file prior to visit.    Review of Systems:  As per HPI- otherwise negative.   Physical Examination: Vitals:   02/02/21 0848 02/02/21 0900  BP: (!) 136/56   Pulse: (!) 48 60  Temp: 97.9 F (36.6 C)   SpO2: 98%    Vitals:   02/02/21 0848  Weight: 248 lb 6.4 oz (112.7 kg)  Height: 6\' 4"  (1.93 m)   Body mass index is 30.24 kg/m. Ideal Body Weight: Weight in (lb) to have BMI = 25: 205  GEN: no acute distress.  Looks well, obese HEENT: Atraumatic, Normocephalic.  Ears and Nose: No external deformity. CV: RRR, No M/G/R. No JVD. No thrill. No extra heart sounds. PULM: CTA B, no wheezes, crackles, rhonchi.  No retractions. No resp. distress. No accessory muscle use. ABD: S, NT, ND, +BS. No rebound. No HSM. EXTR: No c/c/e PSYCH: Normally interactive. Conversant.    Assessment and Plan: Macrocytosis - Plan: Pathologist smear review, CBC, CANCELED: CBC  Essential hypertension - Plan: Comprehensive metabolic panel  Hyperlipidemia, unspecified hyperlipidemia type  Post-polio muscle weakness  Neuropathy  Elevated glucose level - Plan: Hemoglobin A1c  Here today for a routine follow-up visit.  I have noticed macrocytosis, we will work this up today with pathology review and repeat CBC Blood pressure shows good control today Collin Rose is worried about diabetes.  His A1c's have been normal, though he has had some elevated glucose in the past.  We will check an A1c today Post polio  weakness and neuropathy are stable Will plan further follow- up pending labs. Plan for 69-month follow-up if all is well  This visit occurred during the SARS-CoV-2 public health emergency.  Safety protocols were in place, including screening questions prior to the visit, additional usage of staff PPE, and extensive cleaning of exam room while observing appropriate contact time as indicated for disinfecting solutions.   Signed Abbe Amsterdam, MD   Received his labs, letter to pt  Results for orders placed or performed in visit on 02/02/21  Comprehensive metabolic panel  Result Value Ref Range   Sodium 141 135 - 145 mEq/L   Potassium 4.7 3.5 - 5.1 mEq/L   Chloride 106 96 - 112 mEq/L   CO2 28 19 - 32 mEq/L   Glucose, Bld 107 (H) 70 - 99 mg/dL   BUN 18 6 - 23 mg/dL   Creatinine, Ser 1.61 0.40 - 1.50 mg/dL   Total Bilirubin 0.9 0.2 - 1.2 mg/dL   Alkaline Phosphatase 79 39 - 117 U/L   AST 16 0 - 37 U/L   ALT 12 0 - 53 U/L   Total Protein 6.5 6.0 - 8.3 g/dL   Albumin 4.0 3.5 - 5.2 g/dL   GFR 09.60 >45.40 mL/min   Calcium 8.8 8.4 - 10.5 mg/dL  Pathologist smear review  Result Value Ref Range   Path Review    Hemoglobin A1c  Result Value Ref Range   Hgb A1c MFr Bld 5.6 4.6 - 6.5 %  CBC  Result Value Ref Range   WBC 5.1 3.8 - 10.8 Thousand/uL   RBC 4.01 (L) 4.20 - 5.80 Million/uL   Hemoglobin 14.1 13.2 - 17.1 g/dL   HCT 98.1 19.1 - 47.8 %   MCV 101.2 (H) 80.0 - 100.0 fL   MCH 35.2 (H) 27.0 - 33.0 pg   MCHC 34.7 32.0 - 36.0 g/dL   RDW 29.5 62.1 - 30.8 %   Platelets 161 140 - 400 Thousand/uL   MPV 11.4 7.5 - 12.5 fL   Received path review 6/15 Path Review   Comment: Myeloid population consists predominantly of mature  segmented neutrophils with mild reactive changes.  No immature cells are identified. Anemia with RBCs which appear to  macrocytic on smear  review. Suggest evaluation for vitamin deficiency,  if clinically indicated.  Platelet clumps noted on smear-count  appears adequate. Reviewed by  Nehemiah Massed Mammarappallil, MD    Folate and B12 have both been normal in recent past  Will continue to follow

## 2021-02-02 ENCOUNTER — Encounter: Payer: Self-pay | Admitting: Family Medicine

## 2021-02-02 ENCOUNTER — Other Ambulatory Visit: Payer: Self-pay

## 2021-02-02 ENCOUNTER — Ambulatory Visit (INDEPENDENT_AMBULATORY_CARE_PROVIDER_SITE_OTHER): Payer: Medicare Other | Admitting: Family Medicine

## 2021-02-02 VITALS — BP 136/56 | HR 60 | Temp 97.9°F | Ht 76.0 in | Wt 248.4 lb

## 2021-02-02 DIAGNOSIS — G629 Polyneuropathy, unspecified: Secondary | ICD-10-CM

## 2021-02-02 DIAGNOSIS — D649 Anemia, unspecified: Secondary | ICD-10-CM | POA: Diagnosis not present

## 2021-02-02 DIAGNOSIS — M6281 Muscle weakness (generalized): Secondary | ICD-10-CM | POA: Diagnosis not present

## 2021-02-02 DIAGNOSIS — E785 Hyperlipidemia, unspecified: Secondary | ICD-10-CM | POA: Diagnosis not present

## 2021-02-02 DIAGNOSIS — R7309 Other abnormal glucose: Secondary | ICD-10-CM

## 2021-02-02 DIAGNOSIS — I10 Essential (primary) hypertension: Secondary | ICD-10-CM

## 2021-02-02 DIAGNOSIS — D7589 Other specified diseases of blood and blood-forming organs: Secondary | ICD-10-CM | POA: Diagnosis not present

## 2021-02-02 DIAGNOSIS — B91 Sequelae of poliomyelitis: Secondary | ICD-10-CM

## 2021-02-02 LAB — CBC
HCT: 40.6 % (ref 38.5–50.0)
Hemoglobin: 14.1 g/dL (ref 13.2–17.1)
MCH: 35.2 pg — ABNORMAL HIGH (ref 27.0–33.0)
MCHC: 34.7 g/dL (ref 32.0–36.0)
MCV: 101.2 fL — ABNORMAL HIGH (ref 80.0–100.0)
MPV: 11.4 fL (ref 7.5–12.5)
Platelets: 161 10*3/uL (ref 140–400)
RBC: 4.01 10*6/uL — ABNORMAL LOW (ref 4.20–5.80)
RDW: 12.3 % (ref 11.0–15.0)
WBC: 5.1 10*3/uL (ref 3.8–10.8)

## 2021-02-02 LAB — COMPREHENSIVE METABOLIC PANEL
ALT: 12 U/L (ref 0–53)
AST: 16 U/L (ref 0–37)
Albumin: 4 g/dL (ref 3.5–5.2)
Alkaline Phosphatase: 79 U/L (ref 39–117)
BUN: 18 mg/dL (ref 6–23)
CO2: 28 mEq/L (ref 19–32)
Calcium: 8.8 mg/dL (ref 8.4–10.5)
Chloride: 106 mEq/L (ref 96–112)
Creatinine, Ser: 0.98 mg/dL (ref 0.40–1.50)
GFR: 71.24 mL/min (ref >60.00)
Glucose, Bld: 107 mg/dL — ABNORMAL HIGH (ref 70–99)
Potassium: 4.7 mEq/L (ref 3.5–5.1)
Sodium: 141 mEq/L (ref 135–145)
Total Bilirubin: 0.9 mg/dL (ref 0.2–1.2)
Total Protein: 6.5 g/dL (ref 6.0–8.3)

## 2021-02-02 LAB — HEMOGLOBIN A1C: Hgb A1c MFr Bld: 5.6 % (ref 4.6–6.5)

## 2021-02-03 LAB — PATHOLOGIST SMEAR REVIEW

## 2021-05-13 ENCOUNTER — Other Ambulatory Visit: Payer: Self-pay

## 2021-05-13 ENCOUNTER — Ambulatory Visit (INDEPENDENT_AMBULATORY_CARE_PROVIDER_SITE_OTHER): Payer: Medicare Other

## 2021-05-13 DIAGNOSIS — Z23 Encounter for immunization: Secondary | ICD-10-CM

## 2021-06-28 NOTE — Progress Notes (Signed)
[facility] [address] [place], [place] [address] [phone]  Date:  07/01/2021   Name:  Collin Rose   DOB:  [birth year]   MRN:  [id number]  PCP:  Pearline Cables, MD    Chief Complaint: Edema (Left ankle.X 2-3 weeks without any alleviation. No pain.)   History of Present Illness:  Collin Rose is a 86 y.o. very pleasant male patient who presents with the following:  Pt seen today with concern of ankle swelling Last seen by myself in June  History of prediabetes, bradycardia, hypertension, history of polio with resultant muscle weakness and neuropathy Married to Collin Rose who is also my patient  Covid booster  Labs done in June  Flu shot done   BP Readings from Last 3 Encounters:  07/01/21 (!) 194/84  02/02/21 (!) 136/56  12/18/20 (!) 158/80   He has noted some swelling in his left ankle only for abut 2 weeks - not painful He notes it has done this in the past but never lasted like it did this time  NKI- he did not twist his ankle, etc  In the am his ankle might feel a bit stiff  No sx of HTN- no CP or HA  Lab Results  Component Value Date   HGBA1C 5.6 02/02/2021     Patient Active Problem List   Diagnosis Date Noted   Pre-diabetes 09/01/2016   Bradycardia 04/09/2015   Post-polio muscle weakness 02/14/2014   Hypertension 12/13/2011   GERD (gastroesophageal reflux disease) 12/13/2011   Neuropathy 12/13/2011    Past Medical History:  Diagnosis Date   Colon polyps    Heart murmur    Hypertension     Past Surgical History:  Procedure Laterality Date   BACK SURGERY     SPINE SURGERY      Social History   Tobacco Use   Smoking status: Former    Packs/day: 1.00    Types: Cigarettes   Smokeless tobacco: Never  Substance Use Topics   Alcohol use: Yes    Family History  Problem Relation Age of Onset   Cancer Mother    Stroke Father     No Known Allergies  Medication list has  been reviewed and updated.  Current Outpatient Medications on File Prior to Visit  Medication Sig Dispense Refill   b complex vitamins capsule Take 1 capsule by mouth daily.     doxazosin (CARDURA) 8 MG tablet TAKE 1 BY MOUTH AT BEDTIME 90 tablet 3   lisinopril (ZESTRIL) 20 MG tablet Take 1 tablet (20 mg total) by mouth daily. 90 tablet 3   No current facility-administered medications on file prior to visit.    Review of Systems:  As per HPI- otherwise negative.   Physical Examination: Vitals:   07/01/21 1448 07/01/21 1451  BP: (!) 192/80 (!) 194/84  Pulse: 64   Resp: 18   Temp: 97.7 F (36.5 C)   SpO2: 98%    Vitals:   07/01/21 1448  Weight: 252 lb 9.6 oz (114.6 kg)  Height: 6\' 4"  (1.93 m)   Body mass index is 30.75 kg/m. Ideal Body Weight: Weight in (lb) to have BMI = 25: 205  GEN: no acute distress.  Tall build, mild obesity.  Looks well and his normal self HEENT: Atraumatic, Normocephalic.  Ears and Nose: No external deformity. CV: RRR, No M/G/R. No JVD. No thrill. No extra heart sounds. PULM: CTA  B, no wheezes, crackles, rhonchi. No retractions. No resp. distress. No accessory muscle use. ABD: S, NT, ND, +BS. No rebound. No HSM. EXTR: No c/c/e PSYCH: Normally interactive. Conversant.  Left leg displays a slightly tender, slightly edematous area on the medial shin superior to the ankle.  The calf is diffusely slightly swollen but not red, no heat Strong pulses noted in the foot Assessment and Plan: Edema of left lower extremity - Plan: US Venous Img Lower Unilateral Left, hydrochlorothiazide (MICROZIDE) 12.5 MG capsule  Essential hypertension - Plan: hydrochlorothiazide (MICROZIDE) 12.5 MG capsule  Patient here today with concern of possible left lower extremity edema.  The swelling is more localized to the medial shin, does not strongly suggest a DVT but this should be ruled out.  I have ordered an ultrasound, patient plans to have this done tomorrow.  We  scheduled for this afternoon but it was not very convenient for him Blood pressure is also elevated today.  Prescribed hydrochlorothiazide 12.5 for him to take daily for several days for swelling and also to bring down his blood pressure.  He does have a blood pressure cuff at home and will monitor his blood pressures, he agrees to update me in a few days  Signed Abbe Amsterdam, MD

## 2021-07-01 ENCOUNTER — Ambulatory Visit (INDEPENDENT_AMBULATORY_CARE_PROVIDER_SITE_OTHER): Payer: Medicare Other | Admitting: Family Medicine

## 2021-07-01 ENCOUNTER — Other Ambulatory Visit: Payer: Self-pay

## 2021-07-01 ENCOUNTER — Ambulatory Visit (HOSPITAL_BASED_OUTPATIENT_CLINIC_OR_DEPARTMENT_OTHER): Payer: Medicare Other

## 2021-07-01 VITALS — BP 160/80 | HR 64 | Temp 97.7°F | Resp 18 | Ht 76.0 in | Wt 252.6 lb

## 2021-07-01 DIAGNOSIS — R600 Localized edema: Secondary | ICD-10-CM | POA: Diagnosis not present

## 2021-07-01 DIAGNOSIS — I10 Essential (primary) hypertension: Secondary | ICD-10-CM

## 2021-07-01 MED ORDER — HYDROCHLOROTHIAZIDE 12.5 MG PO CAPS: 12.5000 mg | ORAL_CAPSULE | Freq: Every day | ORAL | 3 refills | Status: DC

## 2021-07-01 NOTE — Patient Instructions (Addendum)
It was good to see you again today We will get an ultrasound of your leg to make sure there is no blood clot causing the swelling Ultrasound scheduled at 4:30 pm today [facility]  Assuming there is no clot, use the hydrochlorothiazide 12.5 mg daily until the swelling resolves Please monitor your blood pressure at home

## 2021-07-02 ENCOUNTER — Other Ambulatory Visit: Payer: Self-pay

## 2021-07-02 ENCOUNTER — Ambulatory Visit (HOSPITAL_BASED_OUTPATIENT_CLINIC_OR_DEPARTMENT_OTHER)
Admission: RE | Admit: 2021-07-02 | Discharge: 2021-07-02 | Disposition: A | Discharge status: Home / Self Care | Payer: Medicare Other | Source: Ambulatory Visit | Attending: Family Medicine | Admitting: Family Medicine

## 2021-07-02 DIAGNOSIS — R600 Localized edema: Secondary | ICD-10-CM | POA: Diagnosis not present

## 2021-07-02 DIAGNOSIS — M7989 Other specified soft tissue disorders: Secondary | ICD-10-CM | POA: Diagnosis not present

## 2021-07-20 ENCOUNTER — Other Ambulatory Visit: Payer: Self-pay

## 2021-07-20 ENCOUNTER — Telehealth: Payer: Self-pay | Admitting: Family Medicine

## 2021-07-20 DIAGNOSIS — I10 Essential (primary) hypertension: Secondary | ICD-10-CM

## 2021-07-20 MED ORDER — LISINOPRIL 20 MG PO TABS: 20.0000 mg | ORAL_TABLET | Freq: Every day | ORAL | 3 refills | Status: DC

## 2021-07-20 NOTE — Telephone Encounter (Signed)
Refill sent.

## 2021-07-20 NOTE — Telephone Encounter (Signed)
Medication: lisinopril (ZESTRIL) 20 MG tablet  90 pills, for 3 refills Has the patient contacted their pharmacy? Yes.   (If no, request that the patient contact the pharmacy for the refill.) (If yes, when and what did the pharmacy advise?)  Preferred Pharmacy (with phone number or street name): [facility]) [facility] - [place], [place] - [address]  [address] [place], [place] [address]  Phone:  [phone]  Fax:  [phone]  Agent: Please be advised that RX refills may take up to 3 business days. We ask that you follow-up with your pharmacy.

## 2021-08-09 NOTE — Progress Notes (Addendum)
[facility] [address] [place], [place] [address] [phone]  Date:  08/12/2021   Name:  Collin Rose   DOB:  09/08/1934   MRN:  [id number]  PCP:  Pearline Cables, MD    Chief Complaint: Annual Exam (Concerns/ questions: none/Flu shot: received in September/)   History of Present Illness:  Collin Rose is a 86 y.o. very pleasant male patient who presents with the following:  Dreon is seen today for physical exam- History of prediabetes, bradycardia, hypertension, history of polio with resultant muscle weakness and neuropathy Last visit with myself in November, at that time he was having lower extremity edema; this was treated with a conservative dose of hydrochlorothiazide.  Ultrasound was negative for DVT Blood pressure was also high at that time, recheck today and can make HCTZ permanent if beneficial  He is married to Collin Rose who is also my patient; she had a vaccine reaction to her covid booster and had a fever, seen in the ER but she is well now.  Her symptoms only lasted about 12 hours thankfully  COVID-19 booster- done 2 days ago  Flu shot is complete Shingrix complete Can offer an additional dose of Pneumovax 23- will update today  Labs done in June  He tends to have mild macrocytosis but B12, folate have been normal  Most recent labs done in June, CMP and CBC Can offer a PSA level, lipids, BMP, A1c- we have decided not to continue PSA at this time   Lisinopril 20 Doxazosin 8 HCTZ 12.5 as needed- pt notes he took for a month and is no longer using, stopped 2-3 weeks ago  His ankle swelling has remained resolved  He quit smoking 45- 50 years ago  Patient Active Problem List   Diagnosis Date Noted   Pre-diabetes 09/01/2016   Bradycardia 04/09/2015   Post-polio muscle weakness 02/14/2014   Hypertension 12/13/2011   GERD (gastroesophageal reflux disease) 12/13/2011   Neuropathy 12/13/2011     Past Medical History:  Diagnosis Date   Colon polyps    Heart murmur    Hypertension     Past Surgical History:  Procedure Laterality Date   BACK SURGERY     SPINE SURGERY      Social History   Tobacco Use   Smoking status: Former    Packs/day: 1.00    Types: Cigarettes   Smokeless tobacco: Never  Substance Use Topics   Alcohol use: Yes    Family History  Problem Relation Age of Onset   Cancer Mother    Stroke Father     No Known Allergies  Medication list has been reviewed and updated.  Current Outpatient Medications on File Prior to Visit  Medication Sig Dispense Refill   b complex vitamins capsule Take 1 capsule by mouth daily.     doxazosin (CARDURA) 8 MG tablet TAKE 1 BY MOUTH AT BEDTIME 90 tablet 3   lisinopril (ZESTRIL) 20 MG tablet Take 1 tablet (20 mg total) by mouth daily. 90 tablet 3   No current facility-administered medications on file prior to visit.    Review of Systems:  As per HPI- otherwise negative.   Physical Examination: Vitals:   08/12/21 0841  BP: (!) 144/80  Pulse: 63  Resp: 18  Temp: (!) 97.5 F (36.4 C)  SpO2: 98%   Vitals:   08/12/21 0841  Weight: 249 lb 3.2 oz (113 kg)  Height:  6\' 4"  (1.93 m)   Body mass index is 30.33 kg/m. Ideal Body Weight: Weight in (lb) to have BMI = 25: 205  GEN: no acute distress.  Tall build, overweight.  Looks his normal self HEENT: Atraumatic, Normocephalic.  Ears and Nose: No external deformity. CV: RRR, No M/G/R. No JVD. No thrill. No extra heart sounds. PULM: CTA B, no wheezes, crackles, rhonchi. No retractions. No resp. distress. No accessory muscle use. EXTR: No c/c/e PSYCH: Normally interactive. Conversant.  Clubbing is present in fingers   Assessment and Plan: Essential hypertension - Plan: Basic metabolic panel, doxazosin (CARDURA) 8 MG tablet  Neuropathy  Post-polio muscle weakness  Hyperlipidemia, unspecified hyperlipidemia type - Plan: Lipid panel  Pre-diabetes  - Plan: Hemoglobin A1c  Screening for prostate cancer  Immunization due - Plan: Pneumococcal polysaccharide vaccine 23-valent greater than or equal to 2yo subcutaneous/IM  Lower extremity edema  Collin Rose is seen today for follow-up visit.  Blood pressure under good control on current medications, he is no longer using hydrochlorothiazide His lower extremity edema is currently resolved.  Advised that he can go back and use hydrochlorothiazide periodically if needed  Update pneumonia vaccine today Through mutual decision making we have decided to stop PSA screening due to patient age Follow-up on prediabetes with A1c today Will plan further follow- up pending labs.   Signed Leavy Cella, MD  Received patient labs as below, letter to patient Results for orders placed or performed in visit on 08/12/21  Basic metabolic panel  Result Value Ref Range   Sodium 139 135 - 145 mEq/L   Potassium 4.3 3.5 - 5.1 mEq/L   Chloride 105 96 - 112 mEq/L   CO2 28 19 - 32 mEq/L   Glucose, Bld 96 70 - 99 mg/dL   BUN 15 6 - 23 mg/dL   Creatinine, Ser 08/14/21 0.40 - 1.50 mg/dL   GFR 7.84 69.62 mL/min   Calcium 8.8 8.4 - 10.5 mg/dL  Hemoglobin >95.28  Result Value Ref Range   Hgb A1c MFr Bld 5.8 4.6 - 6.5 %  Lipid panel  Result Value Ref Range   Cholesterol 147 0 - 200 mg/dL   Triglycerides U1L 0.0 - 149.0 mg/dL   HDL 24.4 01.02 mg/dL   VLDL >72.53 0.0 - 66.4 mg/dL   LDL Cholesterol 68 0 - 99 mg/dL   Total CHOL/HDL Ratio 2    NonHDL 80.55

## 2021-08-09 NOTE — Patient Instructions (Addendum)
It was great to see you again today, I will be in touch with your labs.  Assuming all is well please see me in about 6 months  You got your pneumonia booster today Ok to use the fluid pill if you need it for recurrent ankle swelling

## 2021-08-10 ENCOUNTER — Other Ambulatory Visit (HOSPITAL_BASED_OUTPATIENT_CLINIC_OR_DEPARTMENT_OTHER): Payer: Self-pay

## 2021-08-10 ENCOUNTER — Ambulatory Visit: Payer: Medicare Other | Attending: Internal Medicine

## 2021-08-10 DIAGNOSIS — Z23 Encounter for immunization: Secondary | ICD-10-CM

## 2021-08-10 MED ORDER — PFIZER COVID-19 VAC BIVALENT 30 MCG/0.3ML IM SUSP
INTRAMUSCULAR | 0 refills | Status: DC
  Filled 2021-08-10: qty 0.3, 1d supply, fill #0

## 2021-08-10 NOTE — Progress Notes (Signed)
° °  Covid-19 Vaccination Clinic  Name:  DALBERT STILLINGS    MRN: [id number] DOB: 07/09/1935  08/10/2021  Mr. Straw was observed post Covid-19 immunization for 15 minutes without incident. He was provided with Vaccine Information Sheet and instruction to access the V-Safe system.   Mr. Bedwell was instructed to call 911 with any severe reactions post vaccine: Difficulty breathing  Swelling of face and throat  A fast heartbeat  A bad rash all over body  Dizziness and weakness   Immunizations Administered     Name Date Dose VIS Date Route   Pfizer Covid-19 Vaccine Bivalent Booster 08/10/2021  9:26 AM 0.3 mL 04/22/2021 Intramuscular   Manufacturer: [employer], [employer]   Lot: [id number]   NDC: [phone]

## 2021-08-12 ENCOUNTER — Ambulatory Visit (INDEPENDENT_AMBULATORY_CARE_PROVIDER_SITE_OTHER): Payer: Medicare Other | Admitting: Family Medicine

## 2021-08-12 VITALS — BP 144/80 | HR 63 | Temp 97.5°F | Resp 18 | Ht 76.0 in | Wt 249.2 lb

## 2021-08-12 DIAGNOSIS — R7303 Prediabetes: Secondary | ICD-10-CM | POA: Diagnosis not present

## 2021-08-12 DIAGNOSIS — Z23 Encounter for immunization: Secondary | ICD-10-CM

## 2021-08-12 DIAGNOSIS — G629 Polyneuropathy, unspecified: Secondary | ICD-10-CM

## 2021-08-12 DIAGNOSIS — I10 Essential (primary) hypertension: Secondary | ICD-10-CM | POA: Diagnosis not present

## 2021-08-12 DIAGNOSIS — B91 Sequelae of poliomyelitis: Secondary | ICD-10-CM

## 2021-08-12 DIAGNOSIS — M6281 Muscle weakness (generalized): Secondary | ICD-10-CM | POA: Diagnosis not present

## 2021-08-12 DIAGNOSIS — R600 Localized edema: Secondary | ICD-10-CM

## 2021-08-12 DIAGNOSIS — E785 Hyperlipidemia, unspecified: Secondary | ICD-10-CM | POA: Diagnosis not present

## 2021-08-12 DIAGNOSIS — Z125 Encounter for screening for malignant neoplasm of prostate: Secondary | ICD-10-CM

## 2021-08-12 LAB — BASIC METABOLIC PANEL
BUN: 15 mg/dL (ref 6–23)
CO2: 28 mEq/L (ref 19–32)
Calcium: 8.8 mg/dL (ref 8.4–10.5)
Chloride: 105 mEq/L (ref 96–112)
Creatinine, Ser: 0.94 mg/dL (ref 0.40–1.50)
GFR: 74.62 mL/min (ref >60.00)
Glucose, Bld: 96 mg/dL (ref 70–99)
Potassium: 4.3 mEq/L (ref 3.5–5.1)
Sodium: 139 mEq/L (ref 135–145)

## 2021-08-12 LAB — LIPID PANEL
Cholesterol: 147 mg/dL (ref 0–200)
HDL: 66.2 mg/dL (ref >39.00)
LDL Cholesterol: 68 mg/dL (ref 0–99)
NonHDL: 80.55
Total CHOL/HDL Ratio: 2
Triglycerides: 65 mg/dL (ref 0.0–149.0)
VLDL: 13 mg/dL (ref 0.0–40.0)

## 2021-08-12 LAB — HEMOGLOBIN A1C: Hgb A1c MFr Bld: 5.8 % (ref 4.6–6.5)

## 2021-08-12 MED ORDER — DOXAZOSIN MESYLATE 8 MG PO TABS: ORAL_TABLET | ORAL | 3 refills | Status: DC

## 2021-09-21 ENCOUNTER — Ambulatory Visit (INDEPENDENT_AMBULATORY_CARE_PROVIDER_SITE_OTHER): Payer: Medicare Other | Admitting: Family Medicine

## 2021-09-21 VITALS — BP 162/80 | HR 68 | Temp 97.5°F | Resp 18 | Ht 76.0 in | Wt 250.6 lb

## 2021-09-21 DIAGNOSIS — L84 Corns and callosities: Secondary | ICD-10-CM

## 2021-09-21 DIAGNOSIS — L602 Onychogryphosis: Secondary | ICD-10-CM

## 2021-09-21 DIAGNOSIS — I872 Venous insufficiency (chronic) (peripheral): Secondary | ICD-10-CM | POA: Diagnosis not present

## 2021-09-21 DIAGNOSIS — I10 Essential (primary) hypertension: Secondary | ICD-10-CM | POA: Diagnosis not present

## 2021-09-21 MED ORDER — HYDROCHLOROTHIAZIDE 12.5 MG PO TABS: 12.5000 mg | ORAL_TABLET | Freq: Every day | ORAL | 3 refills | Status: DC

## 2021-09-21 NOTE — Patient Instructions (Addendum)
For itching get an OTC non- sedating antihistamine such as zyrtec or claritin and use as needed or itching  Pharmacy- please see above and provide an OTC med, thank you!   Please see me in about 2 months to check on your BP, and your labs  Continue taking HCTZ 12.5 mg daily for swelling and BP control - sent to your mail away pharmacy   Please think about seeing a podiatrist to fix your toenails!  I am glad to refer you

## 2021-09-21 NOTE — Progress Notes (Signed)
[facility] [address], [place], [place] [address] [phone]  Date:  09/21/2021   Name:  Collin Rose   DOB:  [birth year]   MRN:  [id number]  PCP:  Darreld Mclean, MD    Chief Complaint: Edema (Left leg/Concerns/ questions: neuropathy )   History of Present Illness:  Collin Rose is a 87 y.o. very pleasant male patient who presents with the following:  History of prediabetes, bradycardia, hypertension, history of polio with resultant muscle weakness and neuropathy Married to Collin Rose who is also my patient Pt seen today with concern of left leg swelling Last visit with myself was in December  I also saw him in November with left leg swelling- [place] at that time was negative for DVT, I gave him HCTZ to use as needed for swelling  Lab Results  Component Value Date   HGBA1C 5.8 08/12/2021   BP Readings from Last 3 Encounters:  09/21/21 (!) 162/80  08/12/21 (!) 144/80  07/01/21 (!) 160/80   Pt notes that last week his left leg was swelling again- his left leg is always smaller than the right as his left leg had damage from polio.  Therefore, though his right leg measures bigger than his left, his left leg is still larger than it typically is He notes that this leg will be fine in the am, but he notes some swelling towards the afternoon He may also get some itching in his leg He is using some witch hazel on his skin which seems to be helpful  He refilled his HCTZ last week and is taking one daily-Daily use as of a week ago   Weight is stable, he denies any shortness of breath  Wt Readings from Last 3 Encounters:  09/21/21 250 lb 9.6 oz (113.7 kg)  08/12/21 249 lb 3.2 oz (113 kg)  07/01/21 252 lb 9.6 oz (114.6 kg)     Patient Active Problem List   Diagnosis Date Noted   Pre-diabetes 09/01/2016   Bradycardia 04/09/2015   Post-polio muscle weakness 02/14/2014   Hypertension 12/13/2011   GERD  (gastroesophageal reflux disease) 12/13/2011   Neuropathy 12/13/2011    Past Medical History:  Diagnosis Date   Colon polyps    Heart murmur    Hypertension     Past Surgical History:  Procedure Laterality Date   BACK SURGERY     SPINE SURGERY      Social History   Tobacco Use   Smoking status: Former    Packs/day: 1.00    Types: Cigarettes   Smokeless tobacco: Never  Substance Use Topics   Alcohol use: Yes    Family History  Problem Relation Age of Onset   Cancer Mother    Stroke Father     No Known Allergies  Medication list has been reviewed and updated.  Current Outpatient Medications on File Prior to Visit  Medication Sig Dispense Refill   b complex vitamins capsule Take 1 capsule by mouth daily.     doxazosin (CARDURA) 8 MG tablet TAKE 1 BY MOUTH AT BEDTIME 90 tablet 3   lisinopril (ZESTRIL) 20 MG tablet Take 1 tablet (20 mg total) by mouth daily. 90 tablet 3   No current facility-administered medications on file prior to visit.    Review of Systems:  As per HPI- otherwise negative.   Physical Examination: Vitals:   09/21/21 0823  BP: (!) 162/80  Pulse: 68  Resp: 18  Temp: (!) 97.5 F (36.4 C)  SpO2: 98%   Vitals:   09/21/21 0823  Weight: 250 lb 9.6 oz (113.7 kg)  Height: 6\' 4"  (1.93 m)   Body mass index is 30.5 kg/m. Ideal Body Weight: Weight in (lb) to have BMI = 25: 205  GEN: no acute distress.  Tall build, mild obesity.  Appears his normal self HEENT: Atraumatic, Normocephalic.  Ears and Nose: No external deformity. CV: RRR, No M/G/R. No JVD. No thrill. No extra heart sounds. PULM: CTA B, no wheezes, crackles, rhonchi. No retractions. No resp. distress. No accessory muscle use. EXTR: No c/c/e PSYCH: Normally interactive. Conversant.  Foot exam: pt has overgrown, curling toenails and significant callus formation bilaterally  Pt has mild venous infuf swelling apparent at left sock line- the right leg is stronger/ calf more  muscular overall but I can appreciate mild edema in the left Normal pulses in the feet, no calf tenderness  Assessment and Plan: Venous insufficiency  Hypertension - Plan: hydrochlorothiazide (HYDRODIURIL) 12.5 MG tablet, DISCONTINUED: hydrochlorothiazide (HYDRODIURIL) 12.5 MG tablet  Hypertrophic toenail  Foot callus  Patient seen today with concern of mild left lower extremity swelling due to venous insufficiency He was seen for the same issue a few months ago, negative ultrasound for DVT.  Advised that daily hydrochlorothiazide is fine to use, suggested he try compression socks  I offered and encouraged him to see podiatry for foot care several times, he declines for now  Blood pressure is a bit elevated.  We will have him continue hydrochlorothiazide.  Asked him to please see me in about 2 months to check on his blood pressure and labs  Signed Lamar Blinks, MD

## 2021-11-29 NOTE — Progress Notes (Signed)
Nature conservation officer at [employer] ?[address], [address] ?[place], [place] [address] ?336 [phone] ?Fax [phone] ? ?Date:  12/07/2021  ? ?Name:  Collin Rose   DOB:  1935-02-01   MRN:  [id number] ? ?PCP:  Pearline Cables, MD  ? ? ?Chief Complaint: HTN follow up (Concerns/ questions: none) ? ? ?History of Present Illness: ? ?Collin Rose is a 87 y.o. very pleasant male patient who presents with the following: ? ?History of prediabetes, bradycardia, hypertension, history of polio with resultant muscle weakness and neuropathy ?Pt seen today for blood pressure follow-up ?Last seen by myself in January with concern of leg swelling - his left leg is always smaller due to damage from polio ?At last visit I suggested using HCTZ as needed for swelling and compression  ?BP noted to be mildly elevated- made plans to recheck in 2 months ?Taking doxazosin 8, hctz 12.5, lisinopril 20 ? ?His legs are doing well on the HTCZ- he is taking this daily ?Swelling is much better ?He has not noted any SE  ? ?Lab Results  ?Component Value Date  ? HGBA1C 5.8 08/12/2021  ? ? ??psa check- pt declines due to age  ?CMP today  ? ?Patient Active Problem List  ? Diagnosis Date Noted  ? Pre-diabetes 09/01/2016  ? Bradycardia 04/09/2015  ? 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 Hypertension 12/13/2011  ? GERD (gastroesophageal reflux disease) 12/13/2011  ? Neuropathy 12/13/2011  ? ? ?Past Medical History:  ?Diagnosis Date  ? Colon polyps   ? Heart murmur   ? Hypertension   ? ? ?Past Surgical History:  ?Procedure Laterality Date  ? BACK SURGERY    ? SPINE SURGERY    ? ? ?Social History  ? ?Tobacco Use  ? Smoking status: Former  ?  Packs/day: 1.00  ?  Types: Cigarettes  ? Smokeless tobacco: Never  ?Substance Use Topics  ? Alcohol use: Yes  ? ? ?Family History  ?Problem Relation Age of Onset  ? Cancer Mother   ? Stroke Father   ? ? ?No Known Allergies ? ?Medication list has been reviewed and updated. ? ?Current Outpatient  Medications on File Prior to Visit  ?Medication Sig Dispense Refill  ? b complex vitamins capsule Take 1 capsule by mouth daily.    ? doxazosin (CARDURA) 8 MG tablet TAKE 1 BY MOUTH AT BEDTIME 90 tablet 3  ? hydrochlorothiazide (MICROZIDE) 12.5 MG capsule TAKE 1 CAPSULE(12.5 MG) BY MOUTH DAILY 90 capsule 0  ? lisinopril (ZESTRIL) 20 MG tablet Take 1 tablet (20 mg total) by mouth daily. 90 tablet 3  ? ?No current facility-administered medications on file prior to visit.  ? ? ?Review of Systems: ? ?As per HPI- otherwise negative. ? ? ?Physical Examination: ?Vitals:  ? 12/07/21 0839  ?BP: 132/74  ?Pulse: (!) 47  ?Resp: 18  ?Temp: 97.6 ?F (36.4 ?C)  ?SpO2: 97%  ? ?Vitals:  ? 12/07/21 0839  ?Weight: 247 lb 12.8 oz (112.4 kg)  ?Height: 6\' 4"  (1.93 m)  ? ?Body mass index is 30.16 kg/m?. ?Ideal Body Weight: Weight in (lb) to have BMI = 25: 205 ? ?GEN: no acute distress.  Looks well ?Recheck pulse approx 60- he drops occasional beat ?HEENT: Atraumatic, Normocephalic.  ?Ears and Nose: No external deformity. ?CV: RRR, No M/G/R. No JVD. No thrill. No extra heart sounds. ?PULM: CTA B, no wheezes, crackles, rhonchi. No retractions. No resp. distress. No accessory muscle use. ?EXTR: No  c/c/e ?PSYCH: Normally interactive. Conversant.  ? ?BP Readings from Last 3 Encounters:  ?12/07/21 132/74  ?09/21/21 (!) 162/80  ?08/12/21 (!) 144/80  ? ?Pulse Readings from Last 3 Encounters:  ?12/07/21 (!) 47  ?09/21/21 68  ?08/12/21 63  ? ?Wt Readings from Last 3 Encounters:  ?12/07/21 247 lb 12.8 oz (112.4 kg)  ?09/21/21 250 lb 9.6 oz (113.7 kg)  ?08/12/21 249 lb 3.2 oz (113 kg)  ? ? ?Assessment and Plan: ?Medication monitoring encounter - Plan: Comprehensive metabolic panel ? ?Edema of left lower extremity - Plan: hydrochlorothiazide (MICROZIDE) 12.5 MG capsule ? ?Essential hypertension - Plan: hydrochlorothiazide (MICROZIDE) 12.5 MG capsule ?Following up today- BP looks good and swelling is well controlled with addn of 12.5 hctz ?Check labs  today ?Will plan further follow- up pending labs. ? ? ?Signed ?Abbe Amsterdam, MD ? ?

## 2021-11-29 NOTE — Patient Instructions (Addendum)
Good to see you today- I will be in touch with your labs asap ? ?Take care!  Please see me in about 6 months assuming all is well ? ?

## 2021-12-02 ENCOUNTER — Other Ambulatory Visit: Payer: Self-pay | Admitting: Family Medicine

## 2021-12-02 DIAGNOSIS — R600 Localized edema: Secondary | ICD-10-CM

## 2021-12-02 DIAGNOSIS — I10 Essential (primary) hypertension: Secondary | ICD-10-CM

## 2021-12-07 ENCOUNTER — Ambulatory Visit (INDEPENDENT_AMBULATORY_CARE_PROVIDER_SITE_OTHER): Payer: Medicare Other | Admitting: Family Medicine

## 2021-12-07 ENCOUNTER — Encounter: Payer: Self-pay | Admitting: Family Medicine

## 2021-12-07 VITALS — BP 132/74 | HR 47 | Temp 97.6°F | Resp 18 | Ht 76.0 in | Wt 247.8 lb

## 2021-12-07 DIAGNOSIS — R600 Localized edema: Secondary | ICD-10-CM

## 2021-12-07 DIAGNOSIS — Z5181 Encounter for therapeutic drug level monitoring: Secondary | ICD-10-CM

## 2021-12-07 DIAGNOSIS — I10 Essential (primary) hypertension: Secondary | ICD-10-CM

## 2021-12-07 LAB — COMPREHENSIVE METABOLIC PANEL
ALT: 12 U/L (ref 0–53)
AST: 16 U/L (ref 0–37)
Albumin: 3.9 g/dL (ref 3.5–5.2)
Alkaline Phosphatase: 78 U/L (ref 39–117)
BUN: 20 mg/dL (ref 6–23)
CO2: 27 mEq/L (ref 19–32)
Calcium: 8.6 mg/dL (ref 8.4–10.5)
Chloride: 107 mEq/L (ref 96–112)
Creatinine, Ser: 1.08 mg/dL (ref 0.40–1.50)
GFR: 63.03 mL/min (ref >60.00)
Glucose, Bld: 120 mg/dL — ABNORMAL HIGH (ref 70–99)
Potassium: 4.6 mEq/L (ref 3.5–5.1)
Sodium: 140 mEq/L (ref 135–145)
Total Bilirubin: 0.9 mg/dL (ref 0.2–1.2)
Total Protein: 6.3 g/dL (ref 6.0–8.3)

## 2021-12-07 MED ORDER — HYDROCHLOROTHIAZIDE 12.5 MG PO CAPS: ORAL_CAPSULE | ORAL | 3 refills | Status: DC

## 2021-12-21 NOTE — Progress Notes (Signed)
? ?Subjective:  ? Collin Rose is a 87 y.o. male who presents for Medicare Annual/Subsequent preventive examination. ? ?I connected with  LAVONTE PALOS on 12/22/21 by a audio enabled telemedicine application and verified that I am speaking with the correct person using two identifiers. ? ?Patient Location: Home ? ?Provider Location: Office/Clinic ? ?I discussed the limitations of evaluation and management by telemedicine. The patient expressed understanding and agreed to proceed.  ? ? ?Review of Systems    ? ?Cardiac Risk Factors include: advanced age (>82men, >77 women);hypertension ? ?   ?Objective:  ?  ?There were no vitals filed for this visit. ?There is no height or weight on file to calculate BMI. ? ? ?  12/22/2021  ?  8:39 AM 12/22/2021  ?  8:36 AM 12/18/2020  ?  2:38 PM 12/14/2019  ?  1:08 PM 08/14/2016  ?  9:58 AM 11/06/2014  ?  9:35 AM  ?Advanced Directives  ?Does Patient Have a Medical Advance Directive?  Yes No No No No  ?Type of Estate agent of [place];Living will;Out of facility DNR (pink MOST or yellow form) Healthcare Power of [place];Living will;Out of facility DNR (pink MOST or yellow form)      ?Copy of Healthcare Power of Attorney in Chart? Yes - validated most recent copy scanned in chart (See row information)       ?Would patient like information on creating a medical advance directive?   No - Patient declined No - Patient declined  No - patient declined information  ? ? ?Current Medications (verified) ?Outpatient Encounter Medications as of 12/22/2021  ?Medication Sig  ? b complex vitamins capsule Take 1 capsule by mouth daily.  ? doxazosin (CARDURA) 8 MG tablet TAKE 1 BY MOUTH AT BEDTIME  ? hydrochlorothiazide (MICROZIDE) 12.5 MG capsule TAKE 1 CAPSULE(12.5 MG) BY MOUTH DAILY  ? lisinopril (ZESTRIL) 20 MG tablet Take 1 tablet (20 mg total) by mouth daily.  ? ?No facility-administered encounter medications on file as of 12/22/2021.  ? ? ?Allergies (verified) ?Patient has no known  allergies.  ? ?History: ?Past Medical History:  ?Diagnosis Date  ? Colon polyps   ? Heart murmur   ? Hypertension   ? ?Past Surgical History:  ?Procedure Laterality Date  ? BACK SURGERY    ? SPINE SURGERY    ? ?Family History  ?Problem Relation Age of Onset  ? Cancer Mother   ? Stroke Father   ? ?Social History  ? ?Socioeconomic History  ? Marital status: Married  ?  Spouse name: Not on file  ? Number of children: Not on file  ? Years of education: Not on file  ? Highest education level: Not on file  ?Occupational History  ? Occupation: retired  ?Tobacco Use  ? Smoking status: Former  ?  Packs/day: 1.00  ?  Types: Cigarettes  ? Smokeless tobacco: Never  ?Substance and Sexual Activity  ? Alcohol use: Yes  ? Drug use: Not on file  ? Sexual activity: Not on file  ?Other Topics Concern  ? Not on file  ?Social History Narrative  ? Not on file  ? ?Social Determinants of Health  ? ?Financial Resource Strain: Not on file  ?Food Insecurity: Not on file  ?Transportation Needs: Not on file  ?Physical Activity: Not on file  ?Stress: Not on file  ?Social Connections: Not on file  ? ? ?Tobacco Counseling ?Counseling given: Not Answered ? ? ?Clinical Intake: ? ?Pre-visit preparation completed: Yes ? ?  Pain : No/denies pain ? ?  ? ?Nutritional Risks: None ?Diabetes: No ? ?How often do you need to have someone help you when you read instructions, pamphlets, or other written materials from your doctor or pharmacy?: 1 - Never ? ?Diabetic?No ? ?Interpreter Needed?: No ? ?Information entered by :: Bennette Hasty ? ? ?Activities of Daily Living ? ?  12/22/2021  ?  8:38 AM  ?In your present state of health, do you have any difficulty performing the following activities:  ?Hearing? 0  ?Vision? 0  ?Difficulty concentrating or making decisions? 0  ?Walking or climbing stairs? 0  ?Dressing or bathing? 0  ?Doing errands, shopping? 0  ?Preparing Food and eating ? N  ?Using the Toilet? N  ?In the past six months, have you accidently leaked urine? N   ?Do you have problems with loss of bowel control? N  ?Managing your Medications? N  ?Managing your Finances? N  ?Housekeeping or managing your Housekeeping? N  ? ? ?Patient Care Team: ?Copland, Gwenlyn FoundJessica C, MD as PCP - General (Family Medicine) ?Bufford ButtnerStinehelfer, Susan, MD as Consulting Physician (Dermatology) ?Tedd SiasFontaine, Erin, MD as Consulting Physician (Optometry) ? ?Indicate any recent Medical Services you may have received from other than [facility] providers in the past year (date may be approximate). ? ?   ?Assessment:  ? This is a routine wellness examination for [facility]. ? ?Hearing/Vision screen ?No results found. ? ?Dietary issues and exercise activities discussed: ?Current Exercise Habits: Home exercise routine, Type of exercise: walking, Time (Minutes): 30, Frequency (Times/Week): 7, Weekly Exercise (Minutes/Week): 210, Intensity: Mild ? ? Goals Addressed   ? ?  ?  ?  ?  ? This Visit's Progress  ?  Increase physical activity   On track  ? ?  ? ?Depression Screen ? ?  12/22/2021  ?  8:37 AM 08/12/2021  ?  8:45 AM 02/02/2021  ?  8:50 AM 12/18/2020  ?  2:42 PM 12/14/2019  ?  1:20 PM 08/06/2019  ?  8:32 AM 08/03/2018  ?  9:40 AM  ?PHQ 2/9 Scores  ?PHQ - 2 Score 0 0 0 0 0 0 0  ?  ?Fall Risk ? ?  12/22/2021  ?  8:37 AM 08/12/2021  ?  8:44 AM 02/02/2021  ?  8:50 AM 12/18/2020  ?  2:41 PM 12/14/2019  ?  1:11 PM  ?Fall Risk   ?Falls in the past year? 0 0 0 0 0  ?Number falls in past yr: 0 0 0 0 0  ?Injury with Fall? 0 0 0 0 0  ?Risk for fall due to : No Fall Risks  No Fall Risks    ?Follow up Falls evaluation completed  Falls evaluation completed Falls prevention discussed Education provided;Falls prevention discussed  ? ? ?FALL RISK PREVENTION PERTAINING TO THE HOME: ? ?Any stairs in or around the home? Yes  ?If so, are there any without handrails? No  ?Home free of loose throw rugs in walkways, pet beds, electrical cords, etc? Yes  ?Adequate lighting in your home to reduce risk of falls? Yes  ? ?ASSISTIVE DEVICES UTILIZED TO PREVENT  FALLS: ? ?Life alert? No  ?Use of a cane, walker or w/c? No  ?Grab bars in the bathroom? Yes  ?Shower chair or bench in shower? Yes  ?Elevated toilet seat or a handicapped toilet? No  ? ?TIMED UP AND GO: ? ?Was the test performed? No .  ? ? ?Cognitive Function: ?  ?  ? ?  12/22/2021  ?  8:40 AM  ?6CIT Screen  ?What Year? 0 points  ?What month? 0 points  ?What time? 0 points  ?Count back from 20 0 points  ?Months in reverse 0 points  ?Repeat phrase 6 points  ?Total Score 6 points  ? ? ?Immunizations ?Immunization History  ?Administered Date(s) Administered  ? Fluad Quad(high Dose 65+) 05/15/2019, 06/02/2020, 05/13/2021  ? Influenza Split 05/26/2012  ? Influenza, High Dose Seasonal PF 05/27/2017, 05/24/2018  ? Influenza,inj,Quad PF,6+ Mos 05/23/2013, 05/23/2014, 05/26/2015, 05/26/2016  ? Janssen (J&J) SARS-COV-2 Vaccination 11/26/2019  ? PFIZER(Purple Top)SARS-COV-2 Vaccination 07/20/2020  ? Research officer, trade union 97yrs & up 08/10/2021  ? Pneumococcal Conjugate-13 02/14/2014  ? Pneumococcal Polysaccharide-23 08/12/2021  ? Pneumococcal-Unspecified 10/22/2007  ? Td 10/22/2007  ? Tdap 08/06/2018  ? Zoster Recombinat (Shingrix) 06/13/2018, 08/12/2018  ? Zoster, Live 08/23/2013  ? ? ?TDAP status: Up to date ? ?Flu Vaccine status: Up to date ? ?Pneumococcal vaccine status: Up to date ? ?Covid-19 vaccine status: Completed vaccines ? ?Qualifies for Shingles Vaccine? Yes   ?Zostavax completed No   ?Shingrix Completed?: Yes ? ?Screening Tests ?Health Maintenance  ?Topic Date Due  ? COLONOSCOPY (Pts 45-49yrs Insurance coverage will need to be confirmed)  12/20/2021  ? INFLUENZA VACCINE  03/23/2022  ? TETANUS/TDAP  08/06/2028  ? Pneumonia Vaccine 68+ Years old  Completed  ? COVID-19 Vaccine  Completed  ? Zoster Vaccines- Shingrix  Completed  ? HPV VACCINES  Aged Out  ? ? ?Health Maintenance ? ?Health Maintenance Due  ?Topic Date Due  ? COLONOSCOPY (Pts 45-63yrs Insurance coverage will need to be confirmed)   12/20/2021  ? ? ?Colorectal cancer screening: No longer required.  ? ?Lung Cancer Screening: (Low Dose CT Chest recommended if Age 35-80 years, 30 pack-year currently smoking OR have quit w/in 15years.) does n

## 2021-12-22 ENCOUNTER — Ambulatory Visit (INDEPENDENT_AMBULATORY_CARE_PROVIDER_SITE_OTHER): Payer: Medicare Other

## 2021-12-22 DIAGNOSIS — Z0000 Encounter for general adult medical examination without abnormal findings: Secondary | ICD-10-CM

## 2021-12-22 NOTE — Patient Instructions (Signed)
Collin Rose , ?Thank you for taking time to come for your Medicare Wellness Visit. I appreciate your ongoing commitment to your health goals. Please review the following plan we discussed and let me know if I can assist you in the future.  ? ?Screening recommendations/referrals: ?Colonoscopy: no longer needed ?Recommended yearly ophthalmology/optometry visit for glaucoma screening and checkup ?Recommended yearly dental visit for hygiene and checkup ? ?Vaccinations: ?Influenza vaccine: up to date ?Pneumococcal vaccine: up to date ?Tdap vaccine: up to date ?Shingles vaccine: up to date   ?Covid-19: completed ? ?Advanced directives: yes, on file ? ?Conditions/risks identified: see problem list ? ?Next appointment: Follow up in one year for your annual wellness visit.  ? ?Preventive Care 41 Years and Older, Male ?Preventive care refers to lifestyle choices and visits with your health care provider that can promote health and wellness. ?What does preventive care include? ?A yearly physical exam. This is also called an annual well check. ?Dental exams once or twice a year. ?Routine eye exams. Ask your health care provider how often you should have your eyes checked. ?Personal lifestyle choices, including: ?Daily care of your teeth and gums. ?Regular physical activity. ?Eating a healthy diet. ?Avoiding tobacco and drug use. ?Limiting alcohol use. ?Practicing safe sex. ?Taking low doses of aspirin every day. ?Taking vitamin and mineral supplements as recommended by your health care provider. ?What happens during an annual well check? ?The services and screenings done by your health care provider during your annual well check will depend on your age, overall health, lifestyle risk factors, and family history of disease. ?Counseling  ?Your health care provider may ask you questions about your: ?Alcohol use. ?Tobacco use. ?Drug use. ?Emotional well-being. ?Home and relationship well-being. ?Sexual activity. ?Eating  habits. ?History of falls. ?Memory and ability to understand (cognition). ?Work and work Statistician. ?Screening  ?You may have the following tests or measurements: ?Height, weight, and BMI. ?Blood pressure. ?Lipid and cholesterol levels. These may be checked every 5 years, or more frequently if you are over 42 years old. ?Skin check. ?Lung cancer screening. You may have this screening every year starting at age 76 if you have a 30-pack-year history of smoking and currently smoke or have quit within the past 15 years. ?Fecal occult blood test (FOBT) of the stool. You may have this test every year starting at age 65. ?Flexible sigmoidoscopy or colonoscopy. You may have a sigmoidoscopy every 5 years or a colonoscopy every 10 years starting at age 49. ?Prostate cancer screening. Recommendations will vary depending on your family history and other risks. ?Hepatitis C blood test. ?Hepatitis B blood test. ?Sexually transmitted disease (STD) testing. ?Diabetes screening. This is done by checking your blood sugar (glucose) after you have not eaten for a while (fasting). You may have this done every 1-3 years. ?Abdominal aortic aneurysm (AAA) screening. You may need this if you are a current or former smoker. ?Osteoporosis. You may be screened starting at age 45 if you are at high risk. ?Talk with your health care provider about your test results, treatment options, and if necessary, the need for more tests. ?Vaccines  ?Your health care provider may recommend certain vaccines, such as: ?Influenza vaccine. This is recommended every year. ?Tetanus, diphtheria, and acellular pertussis (Tdap, Td) vaccine. You may need a Td booster every 10 years. ?Zoster vaccine. You may need this after age 63. ?Pneumococcal 13-valent conjugate (PCV13) vaccine. One dose is recommended after age 39. ?Pneumococcal polysaccharide (PPSV23) vaccine. One dose is recommended after  age 69. ?Talk to your health care provider about which screenings and  vaccines you need and how often you need them. ?This information is not intended to replace advice given to you by your health care provider. Make sure you discuss any questions you have with your health care provider. ?Document Released: 09/05/2015 Document Revised: 04/28/2016 Document Reviewed: 06/10/2015 ?Elsevier Interactive Patient Education ? 2017 [place]. ? ?Fall Prevention in the Home ?Falls can cause injuries. They can happen to people of all ages. There are many things you can do to make your home safe and to help prevent falls. ?What can I do on the outside of my home? ?Regularly fix the edges of walkways and driveways and fix any cracks. ?Remove anything that might make you trip as you walk through a door, such as a raised step or threshold. ?Trim any bushes or trees on the path to your home. ?Use bright outdoor lighting. ?Clear any walking paths of anything that might make someone trip, such as rocks or tools. ?Regularly check to see if handrails are loose or broken. Make sure that both sides of any steps have handrails. ?Any raised decks and porches should have guardrails on the edges. ?Have any leaves, snow, or ice cleared regularly. ?Use sand or salt on walking paths during winter. ?Clean up any spills in your garage right away. This includes oil or grease spills. ?What can I do in the bathroom? ?Use night lights. ?Install grab bars by the toilet and in the tub and shower. Do not use towel bars as grab bars. ?Use non-skid mats or decals in the tub or shower. ?If you need to sit down in the shower, use a plastic, non-slip stool. ?Keep the floor dry. Clean up any water that spills on the floor as soon as it happens. ?Remove soap buildup in the tub or shower regularly. ?Attach bath mats securely with double-sided non-slip rug tape. ?Do not have throw rugs and other things on the floor that can make you trip. ?What can I do in the bedroom? ?Use night lights. ?Make sure that you have a light by your  bed that is easy to reach. ?Do not use any sheets or blankets that are too big for your bed. They should not hang down onto the floor. ?Have a firm chair that has side arms. You can use this for support while you get dressed. ?Do not have throw rugs and other things on the floor that can make you trip. ?What can I do in the kitchen? ?Clean up any spills right away. ?Avoid walking on wet floors. ?Keep items that you use a lot in easy-to-reach places. ?If you need to reach something above you, use a strong step stool that has a grab bar. ?Keep electrical cords out of the way. ?Do not use floor polish or wax that makes floors slippery. If you must use wax, use non-skid floor wax. ?Do not have throw rugs and other things on the floor that can make you trip. ?What can I do with my stairs? ?Do not leave any items on the stairs. ?Make sure that there are handrails on both sides of the stairs and use them. Fix handrails that are broken or loose. Make sure that handrails are as long as the stairways. ?Check any carpeting to make sure that it is firmly attached to the stairs. Fix any carpet that is loose or worn. ?Avoid having throw rugs at the top or bottom of the stairs. If you do  have throw rugs, attach them to the floor with carpet tape. ?Make sure that you have a light switch at the top of the stairs and the bottom of the stairs. If you do not have them, ask someone to add them for you. ?What else can I do to help prevent falls? ?Wear shoes that: ?Do not have high heels. ?Have rubber bottoms. ?Are comfortable and fit you well. ?Are closed at the toe. Do not wear sandals. ?If you use a stepladder: ?Make sure that it is fully opened. Do not climb a closed stepladder. ?Make sure that both sides of the stepladder are locked into place. ?Ask someone to hold it for you, if possible. ?Clearly mark and make sure that you can see: ?Any grab bars or handrails. ?First and last steps. ?Where the edge of each step is. ?Use tools that  help you move around (mobility aids) if they are needed. These include: ?Canes. ?Walkers. ?Scooters. ?Crutches. ?Turn on the lights when you go into a dark area. Replace any light bulbs as soon as they burn out. ?S

## 2022-01-04 ENCOUNTER — Ambulatory Visit (INDEPENDENT_AMBULATORY_CARE_PROVIDER_SITE_OTHER): Payer: Medicare Other | Admitting: Family Medicine

## 2022-01-04 VITALS — BP 144/68 | HR 61 | Temp 97.9°F | Resp 18 | Wt 249.2 lb

## 2022-01-04 DIAGNOSIS — L03114 Cellulitis of left upper limb: Secondary | ICD-10-CM | POA: Diagnosis not present

## 2022-01-04 DIAGNOSIS — H029 Unspecified disorder of eyelid: Secondary | ICD-10-CM | POA: Diagnosis not present

## 2022-01-04 MED ORDER — AMOXICILLIN-POT CLAVULANATE 875-125 MG PO TABS: 1.0000 | ORAL_TABLET | Freq: Two times a day (BID) | ORAL | 0 refills | Status: DC

## 2022-01-04 NOTE — Patient Instructions (Signed)
Good to see you today- I am afraid the area on the back of your left hand is infected.  Start on the augmentin twice a day- take for 7 days, can use for the full 10 days if needed ?If not significantly better in 1-2 days please alert me ? ?I am concerned about the lesion on you left eyelid - please call your derm and schedule a follow-up with them asap   ?[facility] ?Address: [address] [place], [place], [place] [address] ?Phone: [phone] ?

## 2022-01-04 NOTE — Progress Notes (Signed)
[facility] at [employer] ?[address], [address] ?[place], [place] [address] ?336 [phone] ?Fax [phone] ? ?Date:  01/04/2022  ? ?Name:  Collin Rose   DOB:  1935/02/28   MRN:  [id number] ? ?PCP:  Pearline Cables, MD  ? ? ?Chief Complaint: Insect Bite (Bite on L. Hand on Friday when doing some yard work. + Blistering // treated by popping blister and cleaning it w/ peroxide) ? ? ?History of Present Illness: ? ?Collin Rose is a 87 y.o. very pleasant male patient who presents with the following: ? ?Pt seen today with concern of a bite on his hand- History of prediabetes, bradycardia, hypertension, history of polio with resultant muscle weakness and neuropathy ?Seen by myself last month-  doing well at that time  ? ?He thinks he had an insect bite on the dorsum of his LEFT hand ?He first noted it on Friday- today is Monday ?It was really itchy- could have also been poison ivy or oak he thinks ?In any case, he was scratching it and it swelled and turned red over the last couple of days. ?He notes swelling is actually better today  ?He had a "water blister" which he popped with a pin yesterday  ? ?He otherwise feels well- no fever or chills noted  ? ?Tetanus 2019 ? ?Also, there is a skin lesion on his left upper lid which has been there for couple of months.  I had thought it was a stye, but it is still present today and has a more concerning appearance for skin cancer ? ?Patient Active Problem List  ? Diagnosis Date Noted  ? Pre-diabetes 09/01/2016  ? Bradycardia 04/09/2015  ? 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 Hypertension 12/13/2011  ? GERD (gastroesophageal reflux disease) 12/13/2011  ? Neuropathy 12/13/2011  ? ? ?Past Medical History:  ?Diagnosis Date  ? Colon polyps   ? Heart murmur   ? Hypertension   ? ? ?Past Surgical History:  ?Procedure Laterality Date  ? BACK SURGERY    ? SPINE SURGERY    ? ? ?Social History  ? ?Tobacco Use  ? Smoking status: Former  ?  Packs/day: 1.00  ?   Types: Cigarettes  ? Smokeless tobacco: Never  ?Substance Use Topics  ? Alcohol use: Yes  ? ? ?Family History  ?Problem Relation Age of Onset  ? Cancer Mother   ? Stroke Father   ? ? ?No Known Allergies ? ?Medication list has been reviewed and updated. ? ?Current Outpatient Medications on File Prior to Visit  ?Medication Sig Dispense Refill  ? b complex vitamins capsule Take 1 capsule by mouth daily.    ? doxazosin (CARDURA) 8 MG tablet TAKE 1 BY MOUTH AT BEDTIME 90 tablet 3  ? hydrochlorothiazide (MICROZIDE) 12.5 MG capsule TAKE 1 CAPSULE(12.5 MG) BY MOUTH DAILY 90 capsule 3  ? lisinopril (ZESTRIL) 20 MG tablet Take 1 tablet (20 mg total) by mouth daily. 90 tablet 3  ? ?No current facility-administered medications on file prior to visit.  ? ? ?Review of Systems: ? ?As per HPI- otherwise negative. ? ? ?Physical Examination: ?Vitals:  ? 01/04/22 0955  ?BP: (!) 144/68  ?Pulse: 61  ?Resp: 18  ?Temp: 97.9 ?F (36.6 ?C)  ?SpO2: 97%  ? ?Vitals:  ? 01/04/22 0955  ?Weight: 249 lb 3.2 oz (113 kg)  ? ?Body mass index is 30.33 kg/m?. ?Ideal Body Weight:   ? ?GEN: no acute distress.  Tall  build, looks well ?HEENT: Atraumatic, Normocephalic.  ?Ears and Nose: No external deformity. ?CV: RRR, No M/G/R. No JVD. No thrill. No extra heart sounds. ?PULM: CTA B, no wheezes, crackles, rhonchi. No retractions. No resp. distress. No accessory muscle use. ?EXTR: No c/c/e ?PSYCH: Normally interactive. Conversant.  ?Mild cellulitis dorsum of left hand as pictured below.  Hand is warm and well-perfused, normal strength and range of motion of fingers ?He has a raised, pearly appearing skin lesion on the left upper eyelid ? ? ? ?Assessment and Plan: ?Cellulitis of left hand - Plan: amoxicillin-clavulanate (AUGMENTIN) 875-125 MG tablet ? ?Lesion of eyelid ? ?Cellulitis of left hand.  Starting Augmentin today.  Take twice a day for 7 to 10 days depending on response.  If not getting better within 1 to 2 days he is to let me know.  He agrees to  call his dermatologist today and schedule evaluation for left eyelid lesion-he is an established patient at [facility] dermatology ? ?Signed ?Abbe Amsterdam, MD ? ?

## 2022-01-07 DIAGNOSIS — L821 Other seborrheic keratosis: Secondary | ICD-10-CM | POA: Diagnosis not present

## 2022-01-07 DIAGNOSIS — H00034 Abscess of left upper eyelid: Secondary | ICD-10-CM | POA: Diagnosis not present

## 2022-01-07 DIAGNOSIS — L570 Actinic keratosis: Secondary | ICD-10-CM | POA: Diagnosis not present

## 2022-01-07 DIAGNOSIS — L853 Xerosis cutis: Secondary | ICD-10-CM | POA: Diagnosis not present

## 2022-02-04 DIAGNOSIS — H40013 Open angle with borderline findings, low risk, bilateral: Secondary | ICD-10-CM | POA: Diagnosis not present

## 2022-02-04 DIAGNOSIS — H35033 Hypertensive retinopathy, bilateral: Secondary | ICD-10-CM | POA: Diagnosis not present

## 2022-02-04 DIAGNOSIS — H35373 Puckering of macula, bilateral: Secondary | ICD-10-CM | POA: Diagnosis not present

## 2022-02-04 DIAGNOSIS — H353131 Nonexudative age-related macular degeneration, bilateral, early dry stage: Secondary | ICD-10-CM | POA: Diagnosis not present

## 2022-03-07 NOTE — Progress Notes (Addendum)
[facility] at [facility] [address], [address] [place], [place] [address] [phone]  Date:  03/10/2022   Name:  Collin Rose   DOB:  09-30-1934   MRN:  [id number]  PCP:  Pearline Cables, MD    Chief Complaint: 6 month follow up (Concerns/ questions: none/)   History of Present Illness:  Collin Rose is a 87 y.o. very pleasant male patient who presents with the following:  Patient seen today for periodic follow-up Most recent visit with myself was in May when he had a minor hand infection treated with Augmentin.  This responded nicely to treatment and has resolved History of prediabetes, bradycardia, hypertension, history of polio with resultant muscle weakness and neuropathy Married to Collin Rose, they live independently  Colon cancer screening- he does not wish to continue this screening, last done in 2018 Declines PSA due to age  Lab work done in April, CMP only  Doxazosin HCTZ 12.5 Lisinopril 20  Pt notes he is feeling well No CP or SOB noted  Appetite and digestion are good  He tries to stay active around the house and doing errands His wife does a lot of walking for exercise -he does not join her however, walking is more difficult due to history of polio  Patient Active Problem List   Diagnosis Date Noted   Pre-diabetes 09/01/2016   Bradycardia 04/09/2015   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  Current Outpatient Medications on File Prior to Visit  Medication Sig Dispense Refill   b complex vitamins capsule Take 1 capsule by mouth daily.     doxazosin (CARDURA) 8 MG tablet TAKE 1 BY MOUTH AT BEDTIME 90 tablet 3   hydrochlorothiazide (MICROZIDE) 12.5 MG capsule TAKE 1 CAPSULE(12.5 MG) BY MOUTH DAILY 90 capsule 3   lisinopril (ZESTRIL) 20 MG tablet Take 1 tablet (20 mg total) by mouth daily. 90 tablet 3   No current facility-administered medications on file prior to visit.    Review of Systems:  As per HPI- otherwise negative.   Physical Examination: Vitals:   03/10/22 1016  BP: 132/76  Pulse: 72  Resp: 18  Temp: 97.8 F (36.6 C)  SpO2: 97%   Vitals:   03/10/22 1016  Weight: 248 lb 12.8 oz (112.9 kg)  Height: 6\' 4"  (1.93 m)   Body mass index is 30.28 kg/m. Ideal Body Weight: Weight in (lb) to have BMI = 25: 205  GEN: no acute distress.  Tall build, mildly obese.  Looks his normal self HEENT: Atraumatic, Normocephalic.  Ears and Nose: No external deformity. CV: Noted some pulse irregularity and skipped beats today- no M/G/R. No JVD. No thrill. No extra heart  sounds. PULM: CTA B, no wheezes, crackles, rhonchi. No retractions. No resp. distress. No accessory muscle use. ABD: S, NT, ND. No rebound. No HSM. EXTR: No c/c/e PSYCH: Normally interactive. Conversant.   EKG: First-degree AV block with occasional PAC First-degree block is not new, also present in 2018 Assessment and Plan: Essential hypertension - Plan: CBC, Comprehensive metabolic panel  Neuropathy  Hyperlipidemia, unspecified hyperlipidemia type - Plan: Lipid panel  Pulse irregularity - Plan: EKG 12-Lead  Elevated glucose level - Plan: Hemoglobin A1c  Collin Rose is seen today for follow-up He notes he is feeling well, no particular concerns Labs are pending as above Blood pressure under reasonable control on  current medication EKG reveals benign PACs Plan for follow-up in 6 months assuming all is well Will plan further follow- up pending labs.   Signed Abbe Amsterdam, MD   Received labs as below, letter to pt  Results for orders placed or performed in visit on 03/10/22  CBC  Result Value Ref Range   WBC 5.5 4.0 - 10.5 K/uL   RBC 3.74 (L) 4.22 - 5.81 Mil/uL   Platelets 152.0 150.0 - 400.0 K/uL   Hemoglobin 13.2 13.0 - 17.0 g/dL   HCT 44.0 34.7 - 42.5 %   MCV 106.2 (H) 78.0 - 100.0 fl   MCHC 33.3 30.0 - 36.0 g/dL   RDW 95.6 38.7 - 56.4 %  Comprehensive metabolic panel  Result Value Ref Range   Sodium 142 135 - 145 mEq/L   Potassium 4.4 3.5 - 5.1 mEq/L   Chloride 106 96 - 112 mEq/L   CO2 30 19 - 32 mEq/L   Glucose, Bld 130 (H) 70 - 99 mg/dL   BUN 17 6 - 23 mg/dL   Creatinine, Ser 3.32 0.40 - 1.50 mg/dL   Total Bilirubin 1.0 0.2 - 1.2 mg/dL   Alkaline Phosphatase 84 39 - 117 U/L   AST 19 0 - 37 U/L   ALT 14 0 - 53 U/L   Total Protein 6.3 6.0 - 8.3 g/dL   Albumin 4.0 3.5 - 5.2 g/dL   GFR 95.18 >84.16 mL/min   Calcium 8.5 8.4 - 10.5 mg/dL  Hemoglobin S0Y  Result Value Ref Range   Hgb A1c MFr Bld 5.4 4.6 - 6.5 %  Lipid panel  Result Value Ref Range   Cholesterol 152 0 - 200 mg/dL   Triglycerides 30.1 0.0 - 149.0 mg/dL   HDL 60.10 >93.23 mg/dL   VLDL 55.7 0.0 - 32.2 mg/dL   LDL Cholesterol 73 0 - 99 mg/dL   Total CHOL/HDL Ratio 2    NonHDL 87.54

## 2022-03-07 NOTE — Patient Instructions (Signed)
It was good to see you again today, assuming all is well please see me in about 6 months Consider getting a COVID-19 booster at your pharmacy if not done in the last 6 months

## 2022-03-10 ENCOUNTER — Ambulatory Visit (INDEPENDENT_AMBULATORY_CARE_PROVIDER_SITE_OTHER): Payer: Medicare Other | Admitting: Family Medicine

## 2022-03-10 VITALS — BP 132/76 | HR 72 | Temp 97.8°F | Resp 18 | Ht 76.0 in | Wt 248.8 lb

## 2022-03-10 DIAGNOSIS — G629 Polyneuropathy, unspecified: Secondary | ICD-10-CM

## 2022-03-10 DIAGNOSIS — I10 Essential (primary) hypertension: Secondary | ICD-10-CM | POA: Diagnosis not present

## 2022-03-10 DIAGNOSIS — R0989 Other specified symptoms and signs involving the circulatory and respiratory systems: Secondary | ICD-10-CM | POA: Diagnosis not present

## 2022-03-10 DIAGNOSIS — R7309 Other abnormal glucose: Secondary | ICD-10-CM

## 2022-03-10 DIAGNOSIS — E785 Hyperlipidemia, unspecified: Secondary | ICD-10-CM | POA: Diagnosis not present

## 2022-03-10 LAB — COMPREHENSIVE METABOLIC PANEL
ALT: 14 U/L (ref 0–53)
AST: 19 U/L (ref 0–37)
Albumin: 4 g/dL (ref 3.5–5.2)
Alkaline Phosphatase: 84 U/L (ref 39–117)
BUN: 17 mg/dL (ref 6–23)
CO2: 30 mEq/L (ref 19–32)
Calcium: 8.5 mg/dL (ref 8.4–10.5)
Chloride: 106 mEq/L (ref 96–112)
Creatinine, Ser: 1 mg/dL (ref 0.40–1.50)
GFR: 69 mL/min (ref >60.00)
Glucose, Bld: 130 mg/dL — ABNORMAL HIGH (ref 70–99)
Potassium: 4.4 mEq/L (ref 3.5–5.1)
Sodium: 142 mEq/L (ref 135–145)
Total Bilirubin: 1 mg/dL (ref 0.2–1.2)
Total Protein: 6.3 g/dL (ref 6.0–8.3)

## 2022-03-10 LAB — LIPID PANEL
Cholesterol: 152 mg/dL (ref 0–200)
HDL: 64.8 mg/dL (ref >39.00)
LDL Cholesterol: 73 mg/dL (ref 0–99)
NonHDL: 87.54
Total CHOL/HDL Ratio: 2
Triglycerides: 74 mg/dL (ref 0.0–149.0)
VLDL: 14.8 mg/dL (ref 0.0–40.0)

## 2022-03-10 LAB — CBC
HCT: 39.8 % (ref 39.0–52.0)
Hemoglobin: 13.2 g/dL (ref 13.0–17.0)
MCHC: 33.3 g/dL (ref 30.0–36.0)
MCV: 106.2 fl — ABNORMAL HIGH (ref 78.0–100.0)
Platelets: 152 10*3/uL (ref 150.0–400.0)
RBC: 3.74 Mil/uL — ABNORMAL LOW (ref 4.22–5.81)
RDW: 13.8 % (ref 11.5–15.5)
WBC: 5.5 10*3/uL (ref 4.0–10.5)

## 2022-03-10 LAB — HEMOGLOBIN A1C: Hgb A1c MFr Bld: 5.4 % (ref 4.6–6.5)

## 2022-06-10 ENCOUNTER — Encounter: Payer: Self-pay | Admitting: Family Medicine

## 2022-06-10 ENCOUNTER — Other Ambulatory Visit (HOSPITAL_BASED_OUTPATIENT_CLINIC_OR_DEPARTMENT_OTHER): Payer: Self-pay

## 2022-06-10 ENCOUNTER — Ambulatory Visit (INDEPENDENT_AMBULATORY_CARE_PROVIDER_SITE_OTHER): Payer: Medicare Other

## 2022-06-10 DIAGNOSIS — Z23 Encounter for immunization: Secondary | ICD-10-CM

## 2022-06-10 MED ORDER — COMIRNATY 30 MCG/0.3ML IM SUSY
PREFILLED_SYRINGE | INTRAMUSCULAR | 0 refills | Status: DC
  Filled 2022-06-10: qty 0.3, 1d supply, fill #0

## 2022-07-26 ENCOUNTER — Telehealth: Payer: Self-pay | Admitting: Family Medicine

## 2022-07-26 NOTE — Telephone Encounter (Signed)
Rx was filled on 07/20/22 to this pharmacy for this quantity.

## 2022-07-26 NOTE — Telephone Encounter (Signed)
Pt came in office stating is needing refill on lisinopril (ZESTRIL) 20 MG tablet [[id number]]  sent to: [facility]) [facility] - [place], [place] - [address] [place], [place] [address] Phone: [phone]  Fax: [phone] DEA #: [id number]  Pt stated is needing 90 days supply. Please advise.

## 2022-07-29 ENCOUNTER — Other Ambulatory Visit: Payer: Self-pay

## 2022-07-29 DIAGNOSIS — I10 Essential (primary) hypertension: Secondary | ICD-10-CM

## 2022-07-29 MED ORDER — LISINOPRIL 20 MG PO TABS: 20.0000 mg | ORAL_TABLET | Freq: Every day | ORAL | 3 refills | Status: DC

## 2022-07-29 NOTE — Telephone Encounter (Signed)
Rx has been resent- called pharmacy as well.

## 2022-07-29 NOTE — Telephone Encounter (Signed)
Pt has not received rx.

## 2022-08-14 NOTE — Patient Instructions (Incomplete)
It was great to see you again today, consider dose of RSV if not done already I will be in touch with your labs Assuming all is well please see me in about 6 months  Plain Mucinex can be helpful for mucus in your chest - you can buy this at any pharmacy

## 2022-08-14 NOTE — Progress Notes (Unsigned)
[facility] at [facility] [address] [place], [place] [address] [phone]  Date:  08/18/2022   Name:  Collin Rose   DOB:  1935-05-18   MRN:  [id number]  PCP:  Pearline Cables, MD    Chief Complaint: No chief complaint on file.   History of Present Illness:  Collin Rose is a 87 y.o. very pleasant male patient who presents with the following:  Patient seen today for physical exam Most recent visit with myself was in July History of prediabetes, bradycardia, hypertension, history of polio with resultant muscle weakness and neuropathy Married to [place], they live independently  He has declined further colon cancer screening, PSA due to age which is reasonable Exercise test done in 2018  Flu shot, latest COVID up-to-date Shingrix also complete Most recent lab work done in July  Lisinopril 20 HCTZ 12.5 Doxazosin 8 mg Patient Active Problem List   Diagnosis Date Noted   Pre-diabetes 09/01/2016   Bradycardia 04/09/2015   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  Current Outpatient Medications on File Prior to Visit  Medication Sig Dispense Refill   b complex vitamins capsule Take 1 capsule by mouth daily.     COVID-19 mRNA vaccine 2023-2024 (COMIRNATY) syringe Inject into the muscle. 0.3 mL 0   doxazosin  (CARDURA) 8 MG tablet TAKE 1 BY MOUTH AT BEDTIME 90 tablet 3   hydrochlorothiazide (MICROZIDE) 12.5 MG capsule TAKE 1 CAPSULE(12.5 MG) BY MOUTH DAILY 90 capsule 3   lisinopril (ZESTRIL) 20 MG tablet Take 1 tablet (20 mg total) by mouth daily. 90 tablet 3   No current facility-administered medications on file prior to visit.    Review of Systems:  As per HPI- otherwise negative.   Physical Examination: There were no vitals filed for this visit. There were no vitals filed for this visit. There is no height or weight on file to calculate BMI. Ideal Body Weight:    GEN: no acute distress. HEENT: Atraumatic, Normocephalic.  Ears and Nose: No external deformity. CV: RRR, No M/G/R. No JVD. No thrill. No extra heart sounds. PULM: CTA B, no wheezes, crackles, rhonchi. No retractions. No resp. distress. No accessory muscle use. ABD: S, NT, ND, +BS. No rebound. No HSM. EXTR: No c/c/e PSYCH: Normally interactive. Conversant.    Assessment and Plan: *** Physical exam today.  Encouraged healthy diet and exercise routine  Signed Abbe Amsterdam, MD

## 2022-08-18 ENCOUNTER — Ambulatory Visit (INDEPENDENT_AMBULATORY_CARE_PROVIDER_SITE_OTHER): Payer: Medicare Other | Admitting: Family Medicine

## 2022-08-18 VITALS — BP 122/60 | HR 60 | Temp 97.6°F | Resp 18 | Ht 76.0 in | Wt 247.4 lb

## 2022-08-18 DIAGNOSIS — I499 Cardiac arrhythmia, unspecified: Secondary | ICD-10-CM | POA: Diagnosis not present

## 2022-08-18 DIAGNOSIS — R7303 Prediabetes: Secondary | ICD-10-CM

## 2022-08-18 DIAGNOSIS — I10 Essential (primary) hypertension: Secondary | ICD-10-CM

## 2022-08-18 DIAGNOSIS — M6281 Muscle weakness (generalized): Secondary | ICD-10-CM | POA: Diagnosis not present

## 2022-08-18 DIAGNOSIS — Z0000 Encounter for general adult medical examination without abnormal findings: Secondary | ICD-10-CM | POA: Diagnosis not present

## 2022-08-18 DIAGNOSIS — I440 Atrioventricular block, first degree: Secondary | ICD-10-CM

## 2022-08-18 DIAGNOSIS — B91 Sequelae of poliomyelitis: Secondary | ICD-10-CM

## 2022-08-18 LAB — BASIC METABOLIC PANEL
BUN: 18 mg/dL (ref 6–23)
CO2: 27 mEq/L (ref 19–32)
Calcium: 8.8 mg/dL (ref 8.4–10.5)
Chloride: 101 mEq/L (ref 96–112)
Creatinine, Ser: 1.12 mg/dL (ref 0.40–1.50)
GFR: 60.04 mL/min (ref >60.00)
Glucose, Bld: 102 mg/dL — ABNORMAL HIGH (ref 70–99)
Potassium: 5 mEq/L (ref 3.5–5.1)
Sodium: 136 mEq/L (ref 135–145)

## 2022-08-18 LAB — CBC
HCT: 40.8 % (ref 39.0–52.0)
Hemoglobin: 13.7 g/dL (ref 13.0–17.0)
MCHC: 33.5 g/dL (ref 30.0–36.0)
MCV: 103.8 fl — ABNORMAL HIGH (ref 78.0–100.0)
Platelets: 172 10*3/uL (ref 150.0–400.0)
RBC: 3.93 Mil/uL — ABNORMAL LOW (ref 4.22–5.81)
RDW: 13 % (ref 11.5–15.5)
WBC: 5.4 10*3/uL (ref 4.0–10.5)

## 2022-08-18 LAB — HEMOGLOBIN A1C: Hgb A1c MFr Bld: 5.6 % (ref 4.6–6.5)

## 2022-08-18 MED ORDER — DOXAZOSIN MESYLATE 8 MG PO TABS: ORAL_TABLET | ORAL | 3 refills | Status: DC

## 2022-12-09 ENCOUNTER — Telehealth: Payer: Self-pay | Admitting: Family Medicine

## 2022-12-09 NOTE — Telephone Encounter (Signed)
Contacted Bud Face to schedule their annual wellness visit. Appointment made for 12/24/2022.  Verlee Rossetti; Care Guide Ambulatory Clinical Support [place] [facility] Direct Dial: [phone]

## 2022-12-24 ENCOUNTER — Ambulatory Visit (INDEPENDENT_AMBULATORY_CARE_PROVIDER_SITE_OTHER): Payer: Medicare Other | Admitting: *Deleted

## 2022-12-24 DIAGNOSIS — Z0000 Encounter for general adult medical examination without abnormal findings: Secondary | ICD-10-CM

## 2022-12-24 NOTE — Patient Instructions (Signed)
Collin Rose , Thank you for taking time to come for your Medicare Wellness Visit. I appreciate your ongoing commitment to your health goals. Please review the following plan we discussed and let me know if I can assist you in the future.   These are the goals we discussed:  Goals      Increase physical activity        This is a list of the screening recommended for you and due dates:  Health Maintenance  Topic Date Due   Colon Cancer Screening  12/20/2021   Flu Shot  03/24/2023   Medicare Annual Wellness Visit  12/24/2023   DTaP/Tdap/Td vaccine (3 - Td or Tdap) 08/06/2028   Pneumonia Vaccine  Completed   COVID-19 Vaccine  Completed   Zoster (Shingles) Vaccine  Completed   HPV Vaccine  Aged Out     Next appointment: Follow up in one year for your annual wellness visit.   Preventive Care 84 Years and Older, Male Preventive care refers to lifestyle choices and visits with your health care provider that can promote health and wellness. What does preventive care include? A yearly physical exam. This is also called an annual well check. Dental exams once or twice a year. Routine eye exams. Ask your health care provider how often you should have your eyes checked. Personal lifestyle choices, including: Daily care of your teeth and gums. Regular physical activity. Eating a healthy diet. Avoiding tobacco and drug use. Limiting alcohol use. Practicing safe sex. Taking low doses of aspirin every day. Taking vitamin and mineral supplements as recommended by your health care provider. What happens during an annual well check? The services and screenings done by your health care provider during your annual well check will depend on your age, overall health, lifestyle risk factors, and family history of disease. Counseling  Your health care provider may ask you questions about your: Alcohol use. Tobacco use. Drug use. Emotional well-being. Home and relationship well-being. Sexual  activity. Eating habits. History of falls. Memory and ability to understand (cognition). Work and work Astronomer. Screening  You may have the following tests or measurements: Height, weight, and BMI. Blood pressure. Lipid and cholesterol levels. These may be checked every 5 years, or more frequently if you are over 80 years old. Skin check. Lung cancer screening. You may have this screening every year starting at age 40 if you have a 30-pack-year history of smoking and currently smoke or have quit within the past 15 years. Fecal occult blood test (FOBT) of the stool. You may have this test every year starting at age 36. Flexible sigmoidoscopy or colonoscopy. You may have a sigmoidoscopy every 5 years or a colonoscopy every 10 years starting at age 59. Prostate cancer screening. Recommendations will vary depending on your family history and other risks. Hepatitis C blood test. Hepatitis B blood test. Sexually transmitted disease (STD) testing. Diabetes screening. This is done by checking your blood sugar (glucose) after you have not eaten for a while (fasting). You may have this done every 1-3 years. Abdominal aortic aneurysm (AAA) screening. You may need this if you are a current or former smoker. Osteoporosis. You may be screened starting at age 4 if you are at high risk. Talk with your health care provider about your test results, treatment options, and if necessary, the need for more tests. Vaccines  Your health care provider may recommend certain vaccines, such as: Influenza vaccine. This is recommended every year. Tetanus, diphtheria, and acellular pertussis (  Tdap, Td) vaccine. You may need a Td booster every 10 years. Zoster vaccine. You may need this after age 68. Pneumococcal 13-valent conjugate (PCV13) vaccine. One dose is recommended after age 42. Pneumococcal polysaccharide (PPSV23) vaccine. One dose is recommended after age 79. Talk to your health care provider about which  screenings and vaccines you need and how often you need them. This information is not intended to replace advice given to you by your health care provider. Make sure you discuss any questions you have with your health care provider. Document Released: 09/05/2015 Document Revised: 04/28/2016 Document Reviewed: 06/10/2015 Elsevier Interactive Patient Education  2017 [employer] Prevention in the Home Falls can cause injuries. They can happen to people of all ages. There are many things you can do to make your home safe and to help prevent falls. What can I do on the outside of my home? Regularly fix the edges of walkways and driveways and fix any cracks. Remove anything that might make you trip as you walk through a door, such as a raised step or threshold. Trim any bushes or trees on the path to your home. Use bright outdoor lighting. Clear any walking paths of anything that might make someone trip, such as rocks or tools. Regularly check to see if handrails are loose or broken. Make sure that both sides of any steps have handrails. Any raised decks and porches should have guardrails on the edges. Have any leaves, snow, or ice cleared regularly. Use sand or salt on walking paths during winter. Clean up any spills in your garage right away. This includes oil or grease spills. What can I do in the bathroom? Use night lights. Install grab bars by the toilet and in the tub and shower. Do not use towel bars as grab bars. Use non-skid mats or decals in the tub or shower. If you need to sit down in the shower, use a plastic, non-slip stool. Keep the floor dry. Clean up any water that spills on the floor as soon as it happens. Remove soap buildup in the tub or shower regularly. Attach bath mats securely with double-sided non-slip rug tape. Do not have throw rugs and other things on the floor that can make you trip. What can I do in the bedroom? Use night lights. Make sure that you have a  light by your bed that is easy to reach. Do not use any sheets or blankets that are too big for your bed. They should not hang down onto the floor. Have a firm chair that has side arms. You can use this for support while you get dressed. Do not have throw rugs and other things on the floor that can make you trip. What can I do in the kitchen? Clean up any spills right away. Avoid walking on wet floors. Keep items that you use a lot in easy-to-reach places. If you need to reach something above you, use a strong step stool that has a grab bar. Keep electrical cords out of the way. Do not use floor polish or wax that makes floors slippery. If you must use wax, use non-skid floor wax. Do not have throw rugs and other things on the floor that can make you trip. What can I do with my stairs? Do not leave any items on the stairs. Make sure that there are handrails on both sides of the stairs and use them. Fix handrails that are broken or loose. Make sure that handrails are  as long as the stairways. Check any carpeting to make sure that it is firmly attached to the stairs. Fix any carpet that is loose or worn. Avoid having throw rugs at the top or bottom of the stairs. If you do have throw rugs, attach them to the floor with carpet tape. Make sure that you have a light switch at the top of the stairs and the bottom of the stairs. If you do not have them, ask someone to add them for you. What else can I do to help prevent falls? Wear shoes that: Do not have high heels. Have rubber bottoms. Are comfortable and fit you well. Are closed at the toe. Do not wear sandals. If you use a stepladder: Make sure that it is fully opened. Do not climb a closed stepladder. Make sure that both sides of the stepladder are locked into place. Ask someone to hold it for you, if possible. Clearly mark and make sure that you can see: Any grab bars or handrails. First and last steps. Where the edge of each step  is. Use tools that help you move around (mobility aids) if they are needed. These include: Canes. Walkers. Scooters. Crutches. Turn on the lights when you go into a dark area. Replace any light bulbs as soon as they burn out. Set up your furniture so you have a clear path. Avoid moving your furniture around. If any of your floors are uneven, fix them. If there are any pets around you, be aware of where they are. Review your medicines with your doctor. Some medicines can make you feel dizzy. This can increase your chance of falling. Ask your doctor what other things that you can do to help prevent falls. This information is not intended to replace advice given to you by your health care provider. Make sure you discuss any questions you have with your health care provider. Document Released: 06/05/2009 Document Revised: 01/15/2016 Document Reviewed: 09/13/2014 Elsevier Interactive Patient Education  2017 [employer].

## 2022-12-24 NOTE — Progress Notes (Signed)
Subjective:   Collin Rose is a 88 y.o. male who presents for Medicare Annual/Subsequent preventive examination.  I connected with  Collin Rose on 12/24/22 by a audio enabled telemedicine application and verified that I am speaking with the correct person using two identifiers.  Patient Location: Home  Provider Location: Office/Clinic  I discussed the limitations of evaluation and management by telemedicine. The patient expressed understanding and agreed to proceed.   Review of Systems     Cardiac Risk Factors include: male gender;advanced age (>53men, >73 women);hypertension     Objective:    There were no vitals filed for this visit. There is no height or weight on file to calculate BMI.     12/24/2022   11:25 AM 12/22/2021    8:39 AM 12/22/2021    8:36 AM 12/18/2020    2:38 PM 12/14/2019    1:08 PM 08/14/2016    9:58 AM 11/06/2014    9:35 AM  Advanced Directives  Does Patient Have a Medical Advance Directive? Yes  Yes No No No No  Type of Estate agent of [place];Living will Healthcare Power of [place];Living will;Out of facility DNR (pink MOST or yellow form) Healthcare Power of [place];Living will;Out of facility DNR (pink MOST or yellow form)      Copy of Healthcare Power of Attorney in Chart? Yes - validated most recent copy scanned in chart (See row information) Yes - validated most recent copy scanned in chart (See row information)       Would patient like information on creating a medical advance directive?    No - Patient declined No - Patient declined  No - patient declined information    Current Medications (verified) Outpatient Encounter Medications as of 12/24/2022  Medication Sig   b complex vitamins capsule Take 1 capsule by mouth daily.   doxazosin (CARDURA) 8 MG tablet TAKE 1 BY MOUTH AT BEDTIME   hydrochlorothiazide (MICROZIDE) 12.5 MG capsule TAKE 1 CAPSULE(12.5 MG) BY MOUTH DAILY   lisinopril (ZESTRIL) 20 MG tablet Take 1 tablet (20  mg total) by mouth daily.   No facility-administered encounter medications on file as of 12/24/2022.    Allergies (verified) Patient has no known allergies.   History: Past Medical History:  Diagnosis Date   Colon polyps    Heart murmur    Hypertension    Past Surgical History:  Procedure Laterality Date   BACK SURGERY     SPINE SURGERY     Family History  Problem Relation Age of Onset   Cancer Mother    Stroke Father    Social History   Socioeconomic History   Marital status: Married    Spouse name: Not on file   Number of children: Not on file   Years of education: Not on file   Highest education level: Not on file  Occupational History   Occupation: retired  Tobacco Use   Smoking status: Former    Packs/day: 1    Types: Cigarettes   Smokeless tobacco: Never  Substance and Sexual Activity   Alcohol use: Yes   Drug use: Not on file   Sexual activity: Not on file  Other Topics Concern   Not on file  Social History Narrative   Not on file   Social Determinants of Health   Financial Resource Strain: Low Risk  (12/22/2021)   Overall Financial Resource Strain (CARDIA)    Difficulty of Paying Living Expenses: Not hard at all  Food Insecurity:  No Food Insecurity (12/24/2022)   Hunger Vital Sign    Worried About Running Out of Food in the Last Year: Never true    Ran Out of Food in the Last Year: Never true  Transportation Needs: No Transportation Needs (12/24/2022)   PRAPARE - Administrator, Civil Service (Medical): No    Lack of Transportation (Non-Medical): No  Physical Activity: Sufficiently Active (12/22/2021)   Exercise Vital Sign    Days of Exercise per Week: 7 days    Minutes of Exercise per Session: 30 min  Stress: No Stress Concern Present (12/22/2021)   [employer] of Occupational Health - Occupational Stress Questionnaire    Feeling of Stress : Not at all  Social Connections: Moderately Isolated (12/22/2021)   Social Connection and  Isolation Panel [NHANES]    Frequency of Communication with Friends and Family: Once a week    Frequency of Social Gatherings with Friends and Family: More than three times a week    Attends Religious Services: Never    Database administrator or Organizations: No    Attends Engineer, structural: Never    Marital Status: Married    Tobacco Counseling Counseling given: Not Answered   Clinical Intake:  Pre-visit preparation completed: Yes  Pain : No/denies pain  Nutritional Risks: None Diabetes: No  How often do you need to have someone help you when you read instructions, pamphlets, or other written materials from your doctor or pharmacy?: 1 - Never  Activities of Daily Living    12/24/2022   11:03 AM  In your present state of health, do you have any difficulty performing the following activities:  Hearing? 0  Vision? 0  Difficulty concentrating or making decisions? 0  Walking or climbing stairs? 0  Dressing or bathing? 0  Doing errands, shopping? 0  Preparing Food and eating ? N  Using the Toilet? N  In the past six months, have you accidently leaked urine? N  Do you have problems with loss of bowel control? N  Managing your Medications? N  Managing your Finances? N  Housekeeping or managing your Housekeeping? N    Patient Care Team: Copland, Gwenlyn Found, MD as PCP - General (Family Medicine) Bufford Buttner, MD as Consulting Physician (Dermatology) Tedd Sias, MD as Consulting Physician (Optometry)  Indicate any recent Medical Services you may have received from other than [facility] providers in the past year (date may be approximate).     Assessment:   This is a routine wellness examination for Collin Rose.  Hearing/Vision screen No results found.  Dietary issues and exercise activities discussed: Current Exercise Habits: The patient does not participate in regular exercise at present, Exercise limited by: None identified   Goals Addressed   None     Depression Screen    12/24/2022   11:02 AM 08/18/2022    8:55 AM 12/22/2021    8:37 AM 08/12/2021    8:45 AM 02/02/2021    8:50 AM 12/18/2020    2:42 PM 12/14/2019    1:20 PM  PHQ 2/9 Scores  PHQ - 2 Score 0 0 0 0 0 0 0    Fall Risk    12/24/2022   11:01 AM 08/18/2022    8:55 AM 12/22/2021    8:37 AM 08/12/2021    8:44 AM 02/02/2021    8:50 AM  Fall Risk   Falls in the past year? 0 0 0 0 0  Number falls in past yr: 0 0  0 0 0  Injury with Fall? 0 0 0 0 0  Risk for fall due to : No Fall Risks No Fall Risks No Fall Risks  No Fall Risks  Follow up Falls evaluation completed Falls evaluation completed Falls evaluation completed  Falls evaluation completed    FALL RISK PREVENTION PERTAINING TO THE HOME:  Any stairs in or around the home? Yes  If so, are there any without handrails? No  Home free of loose throw rugs in walkways, pet beds, electrical cords, etc? Yes  Adequate lighting in your home to reduce risk of falls? Yes   ASSISTIVE DEVICES UTILIZED TO PREVENT FALLS:  Life alert? No  Use of a cane, walker or w/c? No  Grab bars in the bathroom? Yes  Shower chair or bench in shower? Yes  Elevated toilet seat or a handicapped toilet? No   TIMED UP AND GO:  Was the test performed?  No, audio visit .   Cognitive Function:        12/24/2022   11:06 AM 12/22/2021    8:40 AM  6CIT Screen  What Year? 0 points 0 points  What month? 0 points 0 points  What time? 0 points 0 points  Count back from 20 0 points 0 points  Months in reverse 0 points 0 points  Repeat phrase 0 points 6 points  Total Score 0 points 6 points    Immunizations Immunization History  Administered Date(s) Administered   COVID-19, mRNA, vaccine(Comirnaty)12 years and older 06/10/2022   Fluad Quad(high Dose 65+) 05/15/2019, 06/02/2020, 05/13/2021, 06/10/2022   Influenza Split 05/26/2012   Influenza, High Dose Seasonal PF 05/27/2017, 05/24/2018   Influenza,inj,Quad PF,6+ Mos 05/23/2013, 05/23/2014,  05/26/2015, 05/26/2016   Janssen (J&J) SARS-COV-2 Vaccination 11/26/2019   PFIZER(Purple Top)SARS-COV-2 Vaccination 07/20/2020   Pfizer Covid-19 Vaccine Bivalent Booster 47yrs & up 08/10/2021   Pneumococcal Conjugate-13 02/14/2014   Pneumococcal Polysaccharide-23 08/12/2021   Pneumococcal-Unspecified 10/22/2007   Td 10/22/2007   Tdap 08/06/2018   Zoster Recombinat (Shingrix) 06/13/2018, 08/12/2018   Zoster, Live 08/23/2013    TDAP status: Up to date  Flu Vaccine status: Up to date  Pneumococcal vaccine status: Up to date  Covid-19 vaccine status: Completed vaccines  Qualifies for Shingles Vaccine? Yes   Zostavax completed Yes   Shingrix Completed?: Yes  Screening Tests Health Maintenance  Topic Date Due   COLONOSCOPY (Pts 45-25yrs Insurance coverage will need to be confirmed)  12/20/2021   Medicare Annual Wellness (AWV)  12/23/2022   INFLUENZA VACCINE  03/24/2023   DTaP/Tdap/Td (3 - Td or Tdap) 08/06/2028   Pneumonia Vaccine 79+ Years old  Completed   COVID-19 Vaccine  Completed   Zoster Vaccines- Shingrix  Completed   HPV VACCINES  Aged Out    Health Maintenance  Health Maintenance Due  Topic Date Due   COLONOSCOPY (Pts 45-52yrs Insurance coverage will need to be confirmed)  12/20/2021   Medicare Annual Wellness (AWV)  12/23/2022    Colorectal cancer screening: No longer required.   Lung Cancer Screening: (Low Dose CT Chest recommended if Age 92-80 years, 30 pack-year currently smoking OR have quit w/in 15years.) does not qualify.   Additional Screening:  Hepatitis C Screening: does not qualify  Vision Screening: Recommended annual ophthalmology exams for early detection of glaucoma and other disorders of the eye. Is the patient up to date with their annual eye exam?  Yes  Who is the provider or what is the name of the office in which the patient  attends annual eye exams? [facility] If pt is not established with a provider, would they like to  be referred to a provider to establish care? No .   Dental Screening: Recommended annual dental exams for proper oral hygiene  Community Resource Referral / Chronic Care Management: CRR required this visit?  No   CCM required this visit?  No      Plan:     I have personally reviewed and noted the following in the patient's chart:   Medical and social history Use of alcohol, tobacco or illicit drugs  Current medications and supplements including opioid prescriptions. Patient is not currently taking opioid prescriptions. Functional ability and status Nutritional status Physical activity Advanced directives List of other physicians Hospitalizations, surgeries, and ER visits in previous 12 months Vitals Screenings to include cognitive, depression, and falls Referrals and appointments  In addition, I have reviewed and discussed with patient certain preventive protocols, quality metrics, and best practice recommendations. A written personalized care plan for preventive services as well as general preventive health recommendations were provided to patient.   Due to this being a telephonic visit, the after visit summary with patients personalized plan was offered to patient via mail or my-chart. Patient declined at this time.  Donne Anon, [place]   12/24/2022   Nurse Notes: None

## 2023-01-21 NOTE — Patient Instructions (Signed)
It was great to see you again today, I will be in touch with your labs as soon as possible Assuming all is well lets plan to follow-up in about 6 months

## 2023-01-21 NOTE — Progress Notes (Unsigned)
[facility] [address] [place], [place] [address] [phone]  Date:  01/24/2023   Name:  Collin Rose   DOB:  1935-07-11   MRN:  [id number]  PCP:  Pearline Cables, MD    Chief Complaint: No chief complaint on file.   History of Present Illness:  Collin Rose is a 88 y.o. very pleasant male patient who presents with the following:  Patient seen today for periodic follow-up Most recent visit with myself was in December for his physical History of prediabetes, bradycardia, hypertension, history of polio with resultant muscle weakness and neuropathy Married to [place], they live independently  He has declined further colon cancer screening, PSA due to age which is reasonable   At our last visit I noted pulse irregularity, he was found to have sinus bradycardia with a first-degree AV block and short PR.  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  Current Outpatient Medications on File Prior to Visit  Medication Sig Dispense Refill   b complex  vitamins capsule Take 1 capsule by mouth daily.     doxazosin (CARDURA) 8 MG tablet TAKE 1 BY MOUTH AT BEDTIME 90 tablet 3   hydrochlorothiazide (MICROZIDE) 12.5 MG capsule TAKE 1 CAPSULE(12.5 MG) BY MOUTH DAILY 90 capsule 3   lisinopril (ZESTRIL) 20 MG tablet Take 1 tablet (20 mg total) by mouth daily. 90 tablet 3   No current facility-administered medications on file prior to visit.    Review of Systems:  As per HPI- otherwise negative.   Physical Examination: There were no vitals filed for this visit. There were no vitals filed for this visit. There is no height or weight on file to calculate BMI. Ideal Body Weight:    GEN: no acute distress. HEENT: Atraumatic, Normocephalic.  Ears and Nose: No external deformity. CV: RRR, No M/G/R. No JVD. No thrill. No extra heart sounds. PULM: CTA B, no wheezes, crackles, rhonchi. No retractions. No resp. distress. No accessory muscle use. ABD: S, NT, ND, +BS. No rebound. No HSM. EXTR: No c/c/e PSYCH: Normally interactive. Conversant.    Assessment and Plan: ***  Signed Abbe Amsterdam, MD

## 2023-01-24 ENCOUNTER — Encounter: Payer: Self-pay | Admitting: Family Medicine

## 2023-01-24 ENCOUNTER — Ambulatory Visit (INDEPENDENT_AMBULATORY_CARE_PROVIDER_SITE_OTHER): Payer: Medicare Other | Admitting: Family Medicine

## 2023-01-24 VITALS — BP 122/75 | HR 60 | Temp 97.6°F | Resp 18 | Ht 76.0 in | Wt 233.8 lb

## 2023-01-24 DIAGNOSIS — I10 Essential (primary) hypertension: Secondary | ICD-10-CM | POA: Diagnosis not present

## 2023-01-24 DIAGNOSIS — R7303 Prediabetes: Secondary | ICD-10-CM | POA: Diagnosis not present

## 2023-01-24 DIAGNOSIS — B91 Sequelae of poliomyelitis: Secondary | ICD-10-CM

## 2023-01-24 DIAGNOSIS — E785 Hyperlipidemia, unspecified: Secondary | ICD-10-CM

## 2023-01-24 DIAGNOSIS — G629 Polyneuropathy, unspecified: Secondary | ICD-10-CM

## 2023-01-24 DIAGNOSIS — D7589 Other specified diseases of blood and blood-forming organs: Secondary | ICD-10-CM

## 2023-01-24 DIAGNOSIS — H6123 Impacted cerumen, bilateral: Secondary | ICD-10-CM

## 2023-01-24 DIAGNOSIS — M6281 Muscle weakness (generalized): Secondary | ICD-10-CM

## 2023-01-24 LAB — COMPREHENSIVE METABOLIC PANEL
ALT: 18 U/L (ref 0–53)
AST: 19 U/L (ref 0–37)
Albumin: 4 g/dL (ref 3.5–5.2)
Alkaline Phosphatase: 81 U/L (ref 39–117)
BUN: 21 mg/dL (ref 6–23)
CO2: 27 mEq/L (ref 19–32)
Calcium: 9.1 mg/dL (ref 8.4–10.5)
Chloride: 101 mEq/L (ref 96–112)
Creatinine, Ser: 1.19 mg/dL (ref 0.40–1.50)
GFR: 55.66 mL/min — ABNORMAL LOW (ref >60.00)
Glucose, Bld: 92 mg/dL (ref 70–99)
Potassium: 4.4 mEq/L (ref 3.5–5.1)
Sodium: 137 mEq/L (ref 135–145)
Total Bilirubin: 1.1 mg/dL (ref 0.2–1.2)
Total Protein: 6.7 g/dL (ref 6.0–8.3)

## 2023-01-24 LAB — FOLATE: Folate: 14.3 ng/mL (ref >5.9)

## 2023-01-24 LAB — LIPID PANEL
Cholesterol: 149 mg/dL (ref 0–200)
HDL: 51.2 mg/dL (ref >39.00)
LDL Cholesterol: 84 mg/dL (ref 0–99)
NonHDL: 97.45
Total CHOL/HDL Ratio: 3
Triglycerides: 69 mg/dL (ref 0.0–149.0)
VLDL: 13.8 mg/dL (ref 0.0–40.0)

## 2023-01-24 LAB — CBC
HCT: 39.6 % (ref 39.0–52.0)
Hemoglobin: 13.3 g/dL (ref 13.0–17.0)
MCHC: 33.6 g/dL (ref 30.0–36.0)
MCV: 100.6 fl — ABNORMAL HIGH (ref 78.0–100.0)
Platelets: 162 10*3/uL (ref 150.0–400.0)
RBC: 3.94 Mil/uL — ABNORMAL LOW (ref 4.22–5.81)
RDW: 12.9 % (ref 11.5–15.5)
WBC: 4.2 10*3/uL (ref 4.0–10.5)

## 2023-01-24 LAB — VITAMIN B12: Vitamin B-12: 675 pg/mL (ref 211–911)

## 2023-01-25 DIAGNOSIS — D0461 Carcinoma in situ of skin of right upper limb, including shoulder: Secondary | ICD-10-CM | POA: Diagnosis not present

## 2023-01-25 DIAGNOSIS — L853 Xerosis cutis: Secondary | ICD-10-CM | POA: Diagnosis not present

## 2023-01-25 DIAGNOSIS — D485 Neoplasm of uncertain behavior of skin: Secondary | ICD-10-CM | POA: Diagnosis not present

## 2023-01-25 DIAGNOSIS — L280 Lichen simplex chronicus: Secondary | ICD-10-CM | POA: Diagnosis not present

## 2023-01-25 DIAGNOSIS — L814 Other melanin hyperpigmentation: Secondary | ICD-10-CM | POA: Diagnosis not present

## 2023-01-25 DIAGNOSIS — L821 Other seborrheic keratosis: Secondary | ICD-10-CM | POA: Diagnosis not present

## 2023-01-25 DIAGNOSIS — D044 Carcinoma in situ of skin of scalp and neck: Secondary | ICD-10-CM | POA: Diagnosis not present

## 2023-01-25 DIAGNOSIS — L570 Actinic keratosis: Secondary | ICD-10-CM | POA: Diagnosis not present

## 2023-02-14 ENCOUNTER — Telehealth: Payer: Self-pay | Admitting: Family Medicine

## 2023-02-14 ENCOUNTER — Other Ambulatory Visit: Payer: Self-pay

## 2023-02-14 DIAGNOSIS — R600 Localized edema: Secondary | ICD-10-CM

## 2023-02-14 DIAGNOSIS — I10 Essential (primary) hypertension: Secondary | ICD-10-CM

## 2023-02-14 MED ORDER — HYDROCHLOROTHIAZIDE 12.5 MG PO CAPS
ORAL_CAPSULE | ORAL | 3 refills | Status: DC
Start: 2023-02-14 — End: 2024-02-22

## 2023-02-14 NOTE — Telephone Encounter (Signed)
Prescription Request  02/14/2023  Is this a "Controlled Substance" medicine? No  LOV: 01/24/2023  What is the name of the medication or equipment?   hydrochlorothiazide (MICROZIDE) 12.5 MG capsule [[id number]]  Have you contacted your pharmacy to request a refill? No   Which pharmacy would you like this sent to?   [facility] - [place], [place] - [address] [place] [address] Phone: [phone] Fax: [phone]    Patient notified that their request is being sent to the clinical staff for review and that they should receive a response within 2 business days.   Please advise at Mobile [phone] (mobile)

## 2023-02-14 NOTE — Telephone Encounter (Signed)
Rx has been refilled.  

## 2023-03-21 DIAGNOSIS — Z961 Presence of intraocular lens: Secondary | ICD-10-CM | POA: Diagnosis not present

## 2023-03-21 DIAGNOSIS — H35033 Hypertensive retinopathy, bilateral: Secondary | ICD-10-CM | POA: Diagnosis not present

## 2023-03-21 DIAGNOSIS — H35373 Puckering of macula, bilateral: Secondary | ICD-10-CM | POA: Diagnosis not present

## 2023-03-21 DIAGNOSIS — H40013 Open angle with borderline findings, low risk, bilateral: Secondary | ICD-10-CM | POA: Diagnosis not present

## 2023-03-21 DIAGNOSIS — H353131 Nonexudative age-related macular degeneration, bilateral, early dry stage: Secondary | ICD-10-CM | POA: Diagnosis not present

## 2023-05-30 ENCOUNTER — Other Ambulatory Visit (HOSPITAL_BASED_OUTPATIENT_CLINIC_OR_DEPARTMENT_OTHER): Payer: Self-pay

## 2023-05-30 ENCOUNTER — Ambulatory Visit (INDEPENDENT_AMBULATORY_CARE_PROVIDER_SITE_OTHER): Payer: Medicare Other

## 2023-05-30 DIAGNOSIS — Z23 Encounter for immunization: Secondary | ICD-10-CM

## 2023-05-30 MED ORDER — COMIRNATY 30 MCG/0.3ML IM SUSY
PREFILLED_SYRINGE | INTRAMUSCULAR | 0 refills | Status: DC
  Filled 2023-05-30: qty 0.3, 1d supply, fill #0

## 2023-08-01 ENCOUNTER — Telehealth: Payer: Self-pay | Admitting: Family Medicine

## 2023-08-01 ENCOUNTER — Other Ambulatory Visit: Payer: Self-pay

## 2023-08-01 DIAGNOSIS — I10 Essential (primary) hypertension: Secondary | ICD-10-CM

## 2023-08-01 MED ORDER — LISINOPRIL 20 MG PO TABS
20.0000 mg | ORAL_TABLET | Freq: Every day | ORAL | 3 refills | Status: DC
Start: 2023-08-01 — End: 2023-08-05

## 2023-08-01 NOTE — Telephone Encounter (Signed)
Pt came in office stating is needing refill on lisinopril (ZESTRIL) 20 MG tablet  90 x 3 refills sent to [facility] [place], [place] - [address] [place], [place] [address] Phone: [phone]  Fax: [phone] DEA #: [id number]   Please advise.

## 2023-08-01 NOTE — Telephone Encounter (Signed)
Rx filled

## 2023-08-05 ENCOUNTER — Other Ambulatory Visit: Payer: Self-pay

## 2023-08-05 DIAGNOSIS — I10 Essential (primary) hypertension: Secondary | ICD-10-CM

## 2023-08-05 MED ORDER — LISINOPRIL 20 MG PO TABS
20.0000 mg | ORAL_TABLET | Freq: Every day | ORAL | 3 refills | Status: DC
Start: 2023-08-05 — End: 2023-09-14

## 2023-08-05 NOTE — Telephone Encounter (Signed)
Pt called to follow up on refill status. After reviewing chart, noted that medication was sent to the incorrect pharmacy. Advised pt a note would be sent back to correct this for him. Routed HP due to internal error.

## 2023-08-05 NOTE — Telephone Encounter (Signed)
Rx sent to mail order. Canceled at local.

## 2023-08-22 ENCOUNTER — Encounter: Payer: Medicare Other | Admitting: Family Medicine

## 2023-08-24 NOTE — Patient Instructions (Signed)
 It was great to see you again today, I will be in touch with your labs as it is possible Recommend a dose of RSV vaccine at your pharmacy if not done already

## 2023-08-24 NOTE — Progress Notes (Deleted)
 [facility] [address], [address] [place], [place] [address] [phone]  Date:  08/29/2023   Name:  Collin Rose   DOB:  Jul 08, 1935   MRN:  [id number]  PCP:  Watt Harlene BROCKS, MD    Chief Complaint: No chief complaint on file.   History of Present Illness:  Collin Rose is a 89 y.o. very pleasant male patient who presents with the following:  Patient seen today for physical exam Most recent visit with myself was in June  He notes no issues since our last visit  History of prediabetes, bradycardia, hypertension, history of polio with resultant muscle weakness and neuropathy Married to Collin Rose, they live independently  He has stopped colon cancer and PSA screening as is appropriate for age  Doxazosin  8 mg at bedtime HCTZ 12.5 Lisinopril  20  He has completed Shingrix, flu and COVID up-to-date Can recommend RSV Lab work done in [employer], lipid, folate, B12, CBC (macrocytosis)  Patient Active Problem List   Diagnosis Date Noted   Pre-diabetes 09/01/2016   Bradycardia 04/09/2015   Post-polio muscle weakness 02/14/2014   Hypertension 12/13/2011   GERD (gastroesophageal reflux disease) 12/13/2011   Neuropathy 12/13/2011    Past Medical History:  Diagnosis Date   Colon polyps    Heart murmur    Hypertension     Past Surgical History:  Procedure Laterality Date   BACK SURGERY     SPINE SURGERY      Social History   Tobacco Use   Smoking status: Former    Current packs/day: 1.00    Types: Cigarettes   Smokeless tobacco: Never  Substance Use Topics   Alcohol use: Yes    Family History  Problem Relation Age of Onset   Cancer Mother    Stroke Father     No Known Allergies  Medication list has been reviewed and updated.  Current Outpatient Medications on File Prior to Visit  Medication Sig Dispense Refill   b complex vitamins capsule Take 1 capsule by mouth daily.     COVID-19 mRNA vaccine,  Pfizer, (COMIRNATY ) syringe Inject into the muscle. 0.3 mL 0   doxazosin  (CARDURA ) 8 MG tablet TAKE 1 BY MOUTH AT BEDTIME 90 tablet 3   hydrochlorothiazide  (MICROZIDE ) 12.5 MG capsule TAKE 1 CAPSULE(12.5 MG) BY MOUTH DAILY 90 capsule 3   lisinopril  (ZESTRIL ) 20 MG tablet Take 1 tablet (20 mg total) by mouth daily. 90 tablet 3   No current facility-administered medications on file prior to visit.    Review of Systems:  As per HPI- otherwise negative.   Physical Examination: There were no vitals filed for this visit. There were no vitals filed for this visit. There is no height or weight on file to calculate BMI. Ideal Body Weight:    GEN: no acute distress. HEENT: Atraumatic, Normocephalic.  Ears and Nose: No external deformity. CV: RRR, No M/G/R. No JVD. No thrill. No extra heart sounds. PULM: CTA B, no wheezes, crackles, rhonchi. No retractions. No resp. distress. No accessory muscle use. ABD: S, NT, ND, +BS. No rebound. No HSM. EXTR: No c/c/e PSYCH: Normally interactive. Conversant.    Assessment and Plan: *** Patient seen today for physical exam.  Encouraged healthy diet and exercise routine Will plan further follow- up pending labs.  Signed Harlene Watt, MD

## 2023-08-26 DIAGNOSIS — Z85828 Personal history of other malignant neoplasm of skin: Secondary | ICD-10-CM | POA: Diagnosis not present

## 2023-08-26 DIAGNOSIS — D044 Carcinoma in situ of skin of scalp and neck: Secondary | ICD-10-CM | POA: Diagnosis not present

## 2023-08-26 DIAGNOSIS — L570 Actinic keratosis: Secondary | ICD-10-CM | POA: Diagnosis not present

## 2023-08-26 DIAGNOSIS — D692 Other nonthrombocytopenic purpura: Secondary | ICD-10-CM | POA: Diagnosis not present

## 2023-08-26 DIAGNOSIS — L821 Other seborrheic keratosis: Secondary | ICD-10-CM | POA: Diagnosis not present

## 2023-08-29 ENCOUNTER — Ambulatory Visit (INDEPENDENT_AMBULATORY_CARE_PROVIDER_SITE_OTHER): Payer: Medicare Other | Admitting: Family Medicine

## 2023-08-29 DIAGNOSIS — Z538 Procedure and treatment not carried out for other reasons: Secondary | ICD-10-CM

## 2023-08-29 DIAGNOSIS — B91 Sequelae of poliomyelitis: Secondary | ICD-10-CM

## 2023-08-29 DIAGNOSIS — I10 Essential (primary) hypertension: Secondary | ICD-10-CM

## 2023-08-29 DIAGNOSIS — R7303 Prediabetes: Secondary | ICD-10-CM

## 2023-08-29 DIAGNOSIS — Z0000 Encounter for general adult medical examination without abnormal findings: Secondary | ICD-10-CM

## 2023-08-30 NOTE — Progress Notes (Signed)
 Snow- canceled

## 2023-09-11 NOTE — Patient Instructions (Incomplete)
Good to see you again today- I will be in touch with your labs asap Recommend shot of RSV if not done already Otherwise please see me in about 6 months for recheck   Let's decrease your lisinopril to 10 mg- I sent this new rx to your mail order pharmacy for you.  You will keep taking lisinopril but just a lower dosage

## 2023-09-11 NOTE — Progress Notes (Addendum)
[facility] [address] [place], [place] [address] [phone]  Date:  09/14/2023   Name:  Collin Rose   DOB:  [birth year]   MRN:  [id number]  PCP:  Pearline Cables, MD    Chief Complaint: Annual Exam (Concerns/ questions: none)   History of Present Illness:  Collin Rose is a 89 y.o. very pleasant male patient who presents with the following:  Pt seen today for recheck/ physical exam Last seen by myself in June  History of prediabetes, bradycardia, hypertension, history of polio with resultant muscle weakness and neuropathy Married to [place], they live independently-they got married a little bit later in life, Collin Rose notes they have been married about 35 years  Labs done in June  Collin Rose is doing well, no concerns - he is feeling well in general  Hydrochlorothiazide  Lisinopril  Recommend RSV- he plans to do this at pharmacy Flu and covid are UTD Shingrix is UTD   His wife Collin Rose is sick with C dfif in the hospital again- she is at [facility] right now.  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   Hypertension 12/13/2011   GERD (gastroesophageal reflux disease) 12/13/2011   Neuropathy 12/13/2011    Past Medical History:  Diagnosis Date   Colon polyps    Heart murmur    Hypertension     Past Surgical History:  Procedure Laterality Date   BACK SURGERY     SPINE SURGERY      Social History   Tobacco Use   Smoking status: Former    Current packs/day: 1.00    Types: Cigarettes   Smokeless tobacco: Never  Substance Use Topics   Alcohol use: Yes    Family History  Problem Relation Age of Onset   Cancer Mother    Stroke Father     No Known  Allergies  Medication list has been reviewed and updated.  Current Outpatient Medications on File Prior to Visit  Medication Sig Dispense Refill   b complex vitamins capsule Take 1 capsule by mouth daily.     doxazosin (CARDURA) 8 MG tablet TAKE 1 BY MOUTH AT BEDTIME 90 tablet 3   hydrochlorothiazide (MICROZIDE) 12.5 MG capsule TAKE 1 CAPSULE(12.5 MG) BY MOUTH DAILY 90 capsule 3   lisinopril (ZESTRIL) 20 MG tablet Take 1 tablet (20 mg total) by mouth daily. 90 tablet 3   No current facility-administered medications on file prior to visit.    Review of Systems:  As per HPI- otherwise negative.  BP Readings from Last 3 Encounters:  09/14/23 120/64  01/24/23 122/75  08/18/22 122/60     Physical Examination: Vitals:   09/14/23 0928  BP: 120/64  Pulse: 65  Resp: 18  Temp: 97.8 F (36.6 C)  SpO2: 97%   Vitals:   09/14/23 0928  Weight: 237 lb 3.2 oz (107.6 kg)  Height: 6\' 4"  (1.93 m)   Body mass index is 28.87 kg/m. Ideal Body Weight: Weight in (lb) to have BMI = 25: 205  GEN: no acute distress.  Tall build, mild overweight.  Looks well HEENT: Atraumatic, Normocephalic.  Ears and Nose: No external deformity. CV: RRR, No M/G/R. No JVD.  No thrill. No extra heart sounds. PULM: CTA B, no wheezes, crackles, rhonchi. No retractions. No resp. distress. No accessory muscle use. ABD: S, NT, ND EXTR: No c/c/e PSYCH: Normally interactive. Conversant.    Assessment and Plan: Physical exam  Essential hypertension - Plan: Basic metabolic panel, CBC, lisinopril (ZESTRIL) 10 MG tablet  Post-polio muscle weakness  Pre-diabetes - Plan: Hemoglobin A1c  Patient seen today for physical exam.  Encouraged healthy diet and exercise routine to the best of his ability. Noted borderline low blood pressure, will have him decrease lisinopril from 20 to 10 mg.  I asked him to please get a dose of RSV at the pharmacy.  I will be in touch with his labs, otherwise we will plan to visit in  about 6 months  Signed Abbe Amsterdam, MD  Received labs as below, 1/24. A1c is relatively stable in prediabetes range GFR has dropped slightly Minimal anemia, this is new Called pt-would like him to come in for labs only in 3-4 weeks Called him and scheduled an appointment for February 17  Results for orders placed or performed in visit on 09/14/23  Basic metabolic panel   Collection Time: 09/14/23  9:55 AM  Result Value Ref Range   Sodium 136 135 - 145 mEq/L   Potassium 4.8 3.5 - 5.1 mEq/L   Chloride 102 96 - 112 mEq/L   CO2 27 19 - 32 mEq/L   Glucose, Bld 101 (H) 70 - 99 mg/dL   BUN 27 (H) 6 - 23 mg/dL   Creatinine, Ser 1.61 0.40 - 1.50 mg/dL   GFR 09.60 (L) >45.40 mL/min   Calcium 9.1 8.4 - 10.5 mg/dL  CBC   Collection Time: 09/14/23  9:55 AM  Result Value Ref Range   WBC 5.5 4.0 - 10.5 K/uL   RBC 3.97 (L) 4.22 - 5.81 Mil/uL   Platelets 169.0 150.0 - 400.0 K/uL   Hemoglobin 12.9 (L) 13.0 - 17.0 g/dL   HCT 98.1 19.1 - 47.8 %   MCV 98.8 78.0 - 100.0 fl   MCHC 32.9 30.0 - 36.0 g/dL   RDW 29.5 62.1 - 30.8 %  Hemoglobin A1c   Collection Time: 09/14/23  9:55 AM  Result Value Ref Range   Hgb A1c MFr Bld 5.8 4.6 - 6.5 %

## 2023-09-14 ENCOUNTER — Telehealth: Payer: Self-pay | Admitting: Emergency Medicine

## 2023-09-14 ENCOUNTER — Other Ambulatory Visit (HOSPITAL_BASED_OUTPATIENT_CLINIC_OR_DEPARTMENT_OTHER): Payer: Self-pay

## 2023-09-14 ENCOUNTER — Ambulatory Visit (INDEPENDENT_AMBULATORY_CARE_PROVIDER_SITE_OTHER): Payer: Medicare Other | Admitting: Family Medicine

## 2023-09-14 VITALS — BP 120/64 | HR 65 | Temp 97.8°F | Resp 18 | Ht 76.0 in | Wt 237.2 lb

## 2023-09-14 DIAGNOSIS — Z0000 Encounter for general adult medical examination without abnormal findings: Secondary | ICD-10-CM | POA: Diagnosis not present

## 2023-09-14 DIAGNOSIS — R7303 Prediabetes: Secondary | ICD-10-CM | POA: Diagnosis not present

## 2023-09-14 DIAGNOSIS — I10 Essential (primary) hypertension: Secondary | ICD-10-CM | POA: Diagnosis not present

## 2023-09-14 DIAGNOSIS — M6281 Muscle weakness (generalized): Secondary | ICD-10-CM

## 2023-09-14 DIAGNOSIS — B91 Sequelae of poliomyelitis: Secondary | ICD-10-CM

## 2023-09-14 LAB — CBC
HCT: 39.2 % (ref 39.0–52.0)
Hemoglobin: 12.9 g/dL — ABNORMAL LOW (ref 13.0–17.0)
MCHC: 32.9 g/dL (ref 30.0–36.0)
MCV: 98.8 fL (ref 78.0–100.0)
Platelets: 169 10*3/uL (ref 150.0–400.0)
RBC: 3.97 Mil/uL — ABNORMAL LOW (ref 4.22–5.81)
RDW: 13.8 % (ref 11.5–15.5)
WBC: 5.5 10*3/uL (ref 4.0–10.5)

## 2023-09-14 LAB — BASIC METABOLIC PANEL
BUN: 27 mg/dL — ABNORMAL HIGH (ref 6–23)
CO2: 27 meq/L (ref 19–32)
Calcium: 9.1 mg/dL (ref 8.4–10.5)
Chloride: 102 meq/L (ref 96–112)
Creatinine, Ser: 1.45 mg/dL (ref 0.40–1.50)
GFR: 43.71 mL/min — ABNORMAL LOW (ref >60.00)
Glucose, Bld: 101 mg/dL — ABNORMAL HIGH (ref 70–99)
Potassium: 4.8 meq/L (ref 3.5–5.1)
Sodium: 136 meq/L (ref 135–145)

## 2023-09-14 LAB — HEMOGLOBIN A1C: Hgb A1c MFr Bld: 5.8 % (ref 4.6–6.5)

## 2023-09-14 IMAGING — US US EXTREM LOW VENOUS*L*
1 series · 13 of 24 positions shown · non-contrast
Comparison: None.

CLINICAL DATA: 86-year-old male left leg swelling



[Series 1: us extrem low venous*left* · 13 of 39 slices shown]
[im 1/39]
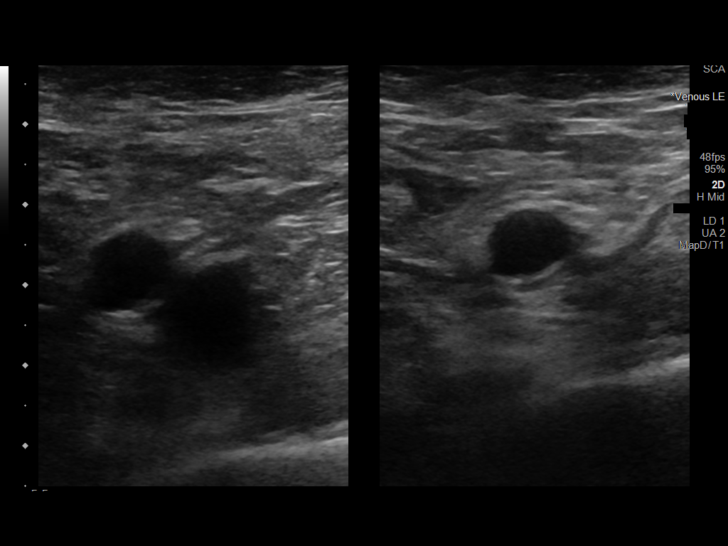
[im 4/39]
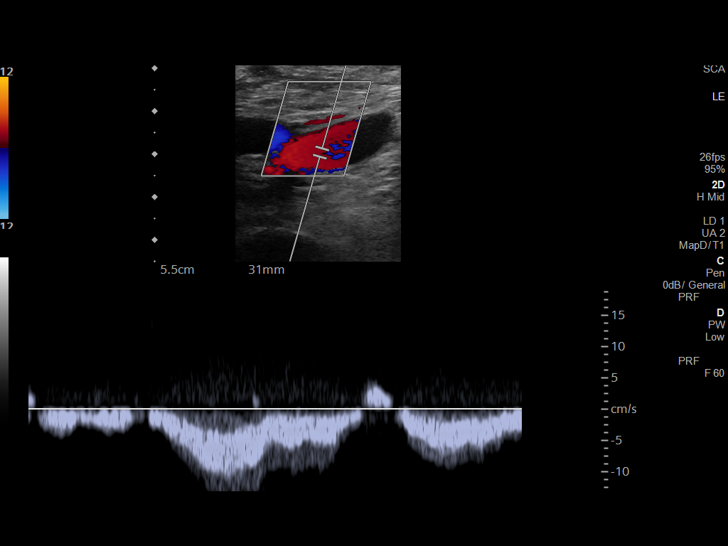
[im 7/39]
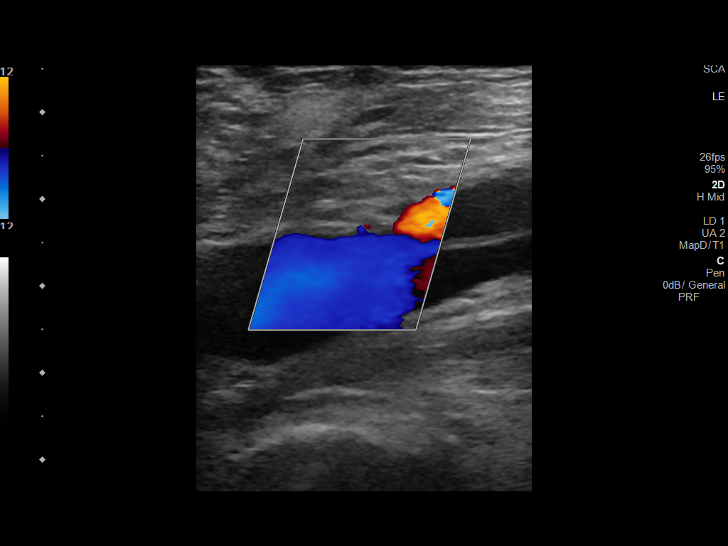
[im 10/39]
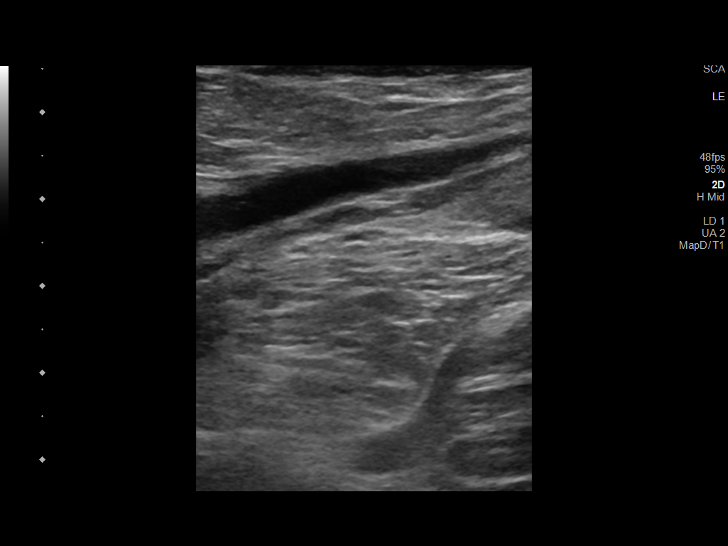
[im 14/39]
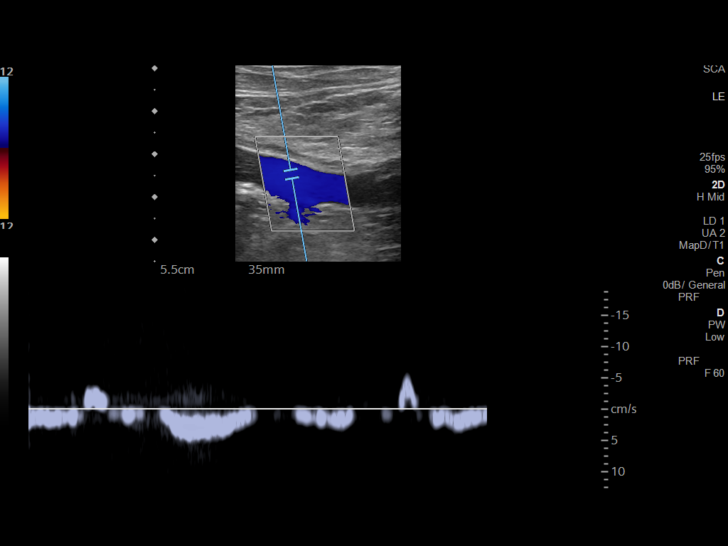
[im 17/39]
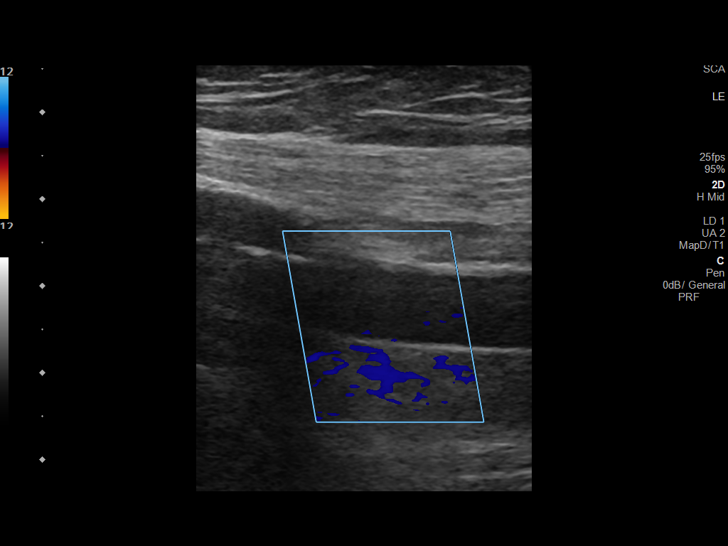
[im 20/39]
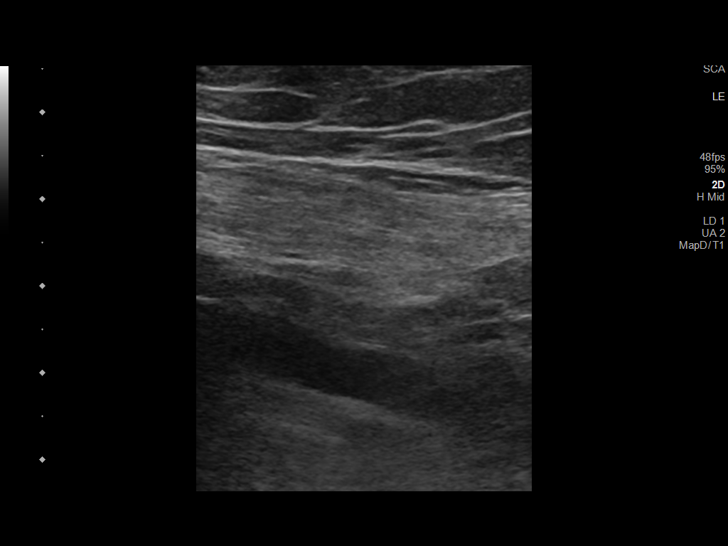
[im 22/39]
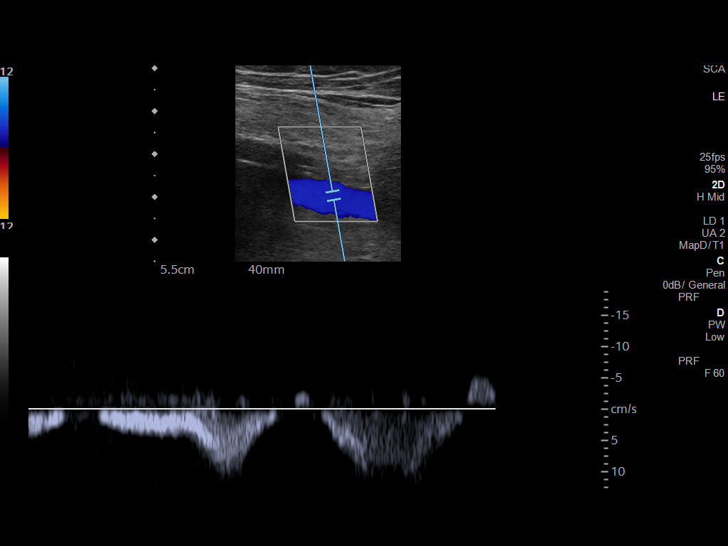
[im 25/39]
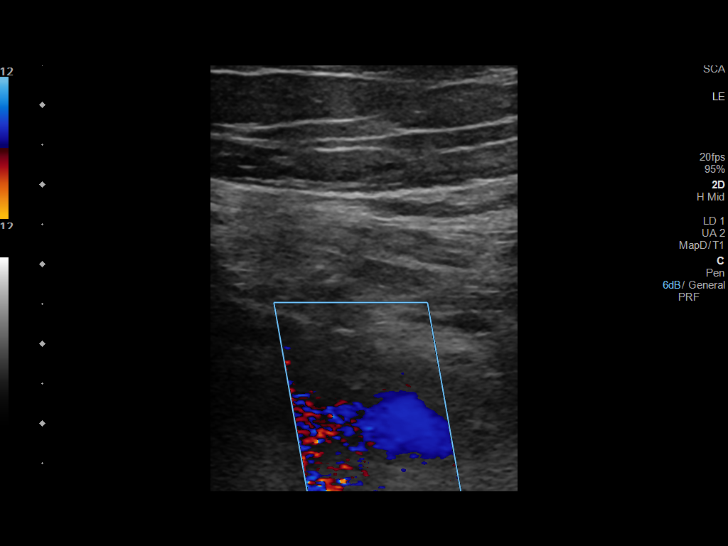
[im 29/39]
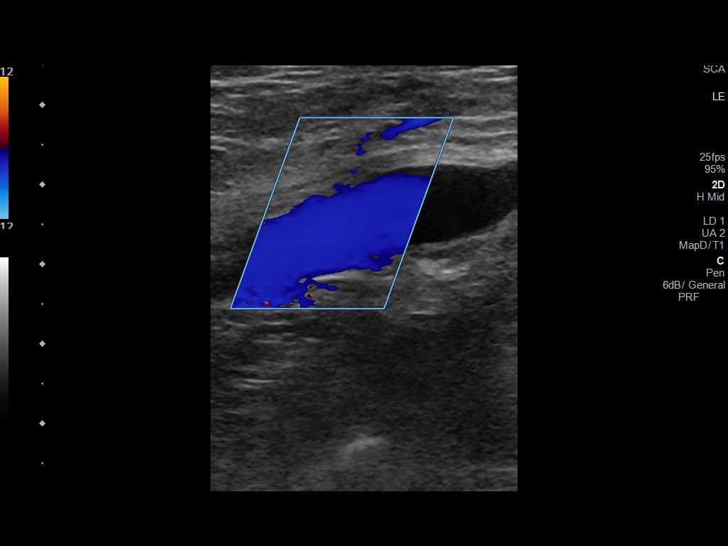
[im 32/39]
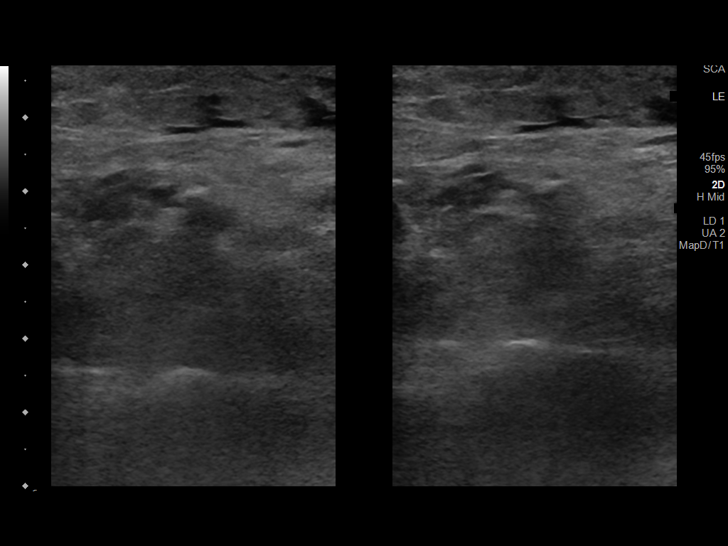
[im 35/39]
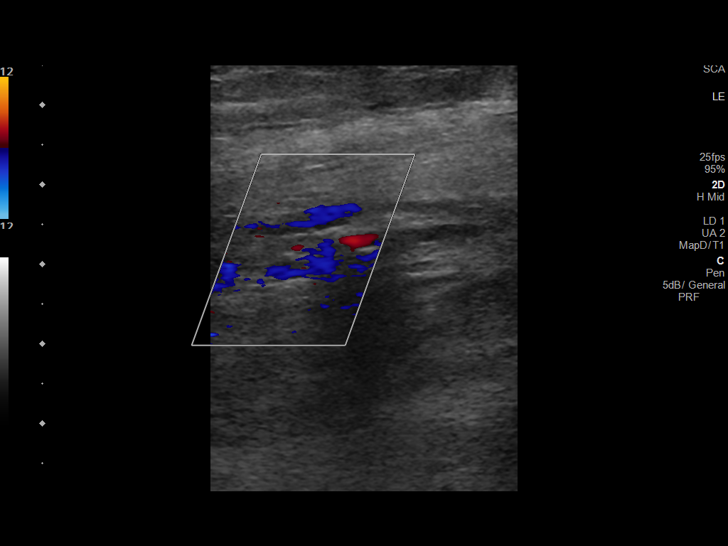
[im 39/39]
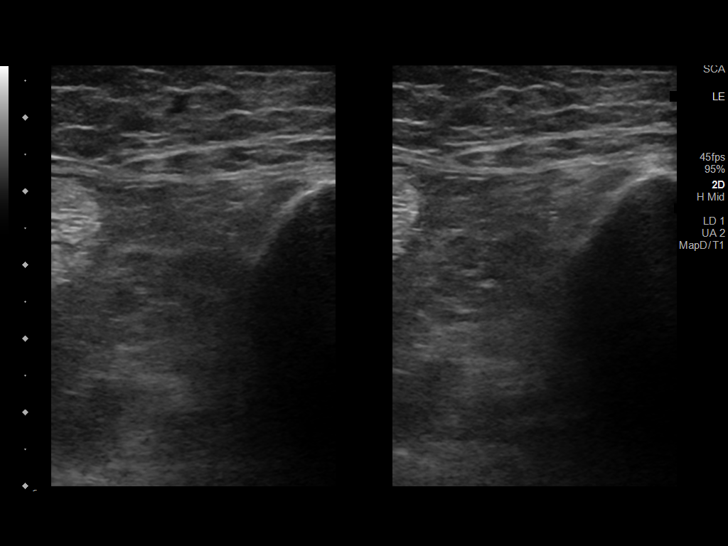

[13 of 24 positions shown; findings below may reference images not displayed]

FINDINGS: Contralateral Common Femoral Vein: Respiratory phasicity is normal
and symmetric with the symptomatic side. No evidence of thrombus.
Normal compressibility.

Common Femoral Vein: No evidence of thrombus. Normal
compressibility, respiratory phasicity and response to augmentation.

Saphenofemoral Junction: No evidence of thrombus. Normal
compressibility and flow on color Doppler imaging.

Profunda Femoral Vein: No evidence of thrombus. Normal
compressibility and flow on color Doppler imaging.

Femoral Vein: No evidence of thrombus. Normal compressibility,
respiratory phasicity and response to augmentation.

Popliteal Vein: No evidence of thrombus. Normal compressibility,
respiratory phasicity and response to augmentation.

Calf Veins: No evidence of thrombus. Normal compressibility and flow
on color Doppler imaging.

Superficial Great Saphenous Vein: No evidence of thrombus. Normal
compressibility and flow on color Doppler imaging.

Other Findings:  None.
IMPRESSION: Sonographic survey of the left lower extremity negative for DVT

## 2023-09-14 MED ORDER — RSVPREF3 VAC RECOMB ADJUVANTED 120 MCG/0.5ML IM SUSR
0.5000 mL | Freq: Once | INTRAMUSCULAR | 0 refills | Status: AC
  Filled 2023-09-14: qty 0.5, 1d supply, fill #0

## 2023-09-14 MED ORDER — LISINOPRIL 10 MG PO TABS: 10.0000 mg | ORAL_TABLET | Freq: Every day | ORAL | 3 refills | Status: DC

## 2023-09-14 NOTE — Telephone Encounter (Signed)
Copied from CRM [id number]. Topic: Clinical - Prescription Issue >> Sep 14, 2023  1:04 PM Sonny Dandy B wrote: Reason for CRM: French Ana from [employer] called to clarify pt's prescription for Lisiopril 10mg  please call back to clarify pt prescription at [phone]

## 2023-09-14 NOTE — Telephone Encounter (Signed)
Called and gave the okay for pharmacy to d/c Lisinopril 20 mg dose since Dr Patsy Lager advised pt to take 10 mg instead.

## 2023-09-16 NOTE — Addendum Note (Signed)
Addended by: Abbe Amsterdam C on: 09/16/2023 02:51 PM   Modules accepted: Orders

## 2023-10-06 ENCOUNTER — Other Ambulatory Visit: Payer: Self-pay | Admitting: Family Medicine

## 2023-10-06 DIAGNOSIS — I10 Essential (primary) hypertension: Secondary | ICD-10-CM

## 2023-10-06 MED ORDER — DOXAZOSIN MESYLATE 8 MG PO TABS: 8.0000 mg | ORAL_TABLET | Freq: Every day | ORAL | 1 refills | Status: DC

## 2023-10-06 NOTE — Telephone Encounter (Signed)
Copied from CRM [id number]. Topic: Clinical - Medication Refill >> Oct 06, 2023  3:17 PM Kathryne Eriksson wrote: Most Recent Primary Care Visit:  Provider: Pearline Cables  Department: [facility]  Visit Type: PHYSICAL  Date: 09/14/2023  Medication: doxazosin (CARDURA) 8 MG tablet  Has the patient contacted their pharmacy? Yes (Agent: If no, request that the patient contact the pharmacy for the refill. If patient does not wish to contact the pharmacy document the reason why and proceed with request.) (Agent: If yes, when and what did the pharmacy advise?)  Is this the correct pharmacy for this prescription? Yes If no, delete pharmacy and type the correct one.  This is the patient's preferred pharmacy:    [facility] - [place], [place] - [address] [place] [address] Phone: [phone] Fax: [phone]   Has the prescription been filled recently? No  Is the patient out of the medication? No  Has the patient been seen for an appointment in the last year OR does the patient have an upcoming appointment? Yes  Can we respond through MyChart? Yes  Agent: Please be advised that Rx refills may take up to 3 business days. We ask that you follow-up with your pharmacy.

## 2023-10-10 ENCOUNTER — Other Ambulatory Visit (INDEPENDENT_AMBULATORY_CARE_PROVIDER_SITE_OTHER): Payer: Medicare Other

## 2023-10-10 DIAGNOSIS — I10 Essential (primary) hypertension: Secondary | ICD-10-CM | POA: Diagnosis not present

## 2023-10-10 LAB — BASIC METABOLIC PANEL
BUN: 23 mg/dL (ref 6–23)
CO2: 26 meq/L (ref 19–32)
Calcium: 8.8 mg/dL (ref 8.4–10.5)
Chloride: 102 meq/L (ref 96–112)
Creatinine, Ser: 1.23 mg/dL (ref 0.40–1.50)
GFR: 53.23 mL/min — ABNORMAL LOW (ref >60.00)
Glucose, Bld: 100 mg/dL — ABNORMAL HIGH (ref 70–99)
Potassium: 4.8 meq/L (ref 3.5–5.1)
Sodium: 137 meq/L (ref 135–145)

## 2023-10-10 LAB — CBC
HCT: 38.2 % — ABNORMAL LOW (ref 39.0–52.0)
Hemoglobin: 12.7 g/dL — ABNORMAL LOW (ref 13.0–17.0)
MCHC: 33.2 g/dL (ref 30.0–36.0)
MCV: 99.2 fL (ref 78.0–100.0)
Platelets: 159 10*3/uL (ref 150.0–400.0)
RBC: 3.85 Mil/uL — ABNORMAL LOW (ref 4.22–5.81)
RDW: 13.8 % (ref 11.5–15.5)
WBC: 5.3 10*3/uL (ref 4.0–10.5)

## 2023-10-13 ENCOUNTER — Other Ambulatory Visit: Payer: Self-pay | Admitting: Family Medicine

## 2023-10-13 ENCOUNTER — Telehealth: Payer: Self-pay | Admitting: Family Medicine

## 2023-10-13 DIAGNOSIS — D649 Anemia, unspecified: Secondary | ICD-10-CM

## 2023-10-13 DIAGNOSIS — I10 Essential (primary) hypertension: Secondary | ICD-10-CM

## 2023-10-13 NOTE — Telephone Encounter (Signed)
Called pt and went over his labs Renal function a bit improved Mild anemia is stable- this is new over the last 8 months Will send a copy of his labs and a stool test- would like to rule out occult blood

## 2023-10-13 NOTE — Telephone Encounter (Signed)
Copied from CRM [id number]. Topic: Clinical - Medication Refill >> Oct 13, 2023  1:46 PM Kathryne Eriksson wrote: Most Recent Primary Care Visit:  Provider: Pearline Cables  Department: [facility]  Visit Type: PHYSICAL  Date: 09/14/2023  Medication: doxazosin (CARDURA) 8 MG tablet  Has the patient contacted their pharmacy? Yes (Agent: If no, request that the patient contact the pharmacy for the refill. If patient does not wish to contact the pharmacy document the reason why and proceed with request.) (Agent: If yes, when and what did the pharmacy advise?)  Is this the correct pharmacy for this prescription? Yes If no, delete pharmacy and type the correct one.  This is the patient's preferred pharmacy:   [facility] - [place], [place] - [address] [place] [address] Phone: [phone] Fax: [phone]   Has the prescription been filled recently? No  Is the patient out of the medication? No  Has the patient been seen for an appointment in the last year OR does the patient have an upcoming appointment? Yes  Can we respond through MyChart? Yes  Agent: Please be advised that Rx refills may take up to 3 business days. We ask that you follow-up with your pharmacy.

## 2023-10-18 ENCOUNTER — Telehealth: Payer: Self-pay

## 2023-10-18 NOTE — Telephone Encounter (Signed)
 error

## 2023-10-19 ENCOUNTER — Telehealth: Payer: Self-pay | Admitting: Emergency Medicine

## 2023-10-19 ENCOUNTER — Other Ambulatory Visit: Payer: Self-pay

## 2023-10-19 DIAGNOSIS — I10 Essential (primary) hypertension: Secondary | ICD-10-CM

## 2023-10-19 MED ORDER — DOXAZOSIN MESYLATE 8 MG PO TABS: 8.0000 mg | ORAL_TABLET | Freq: Every day | ORAL | 1 refills | Status: DC

## 2023-10-19 NOTE — Telephone Encounter (Signed)
 Copied from CRM [id number]. Topic: Clinical - Prescription Issue >> Oct 19, 2023 12:28 PM Sim Boast F wrote: Reason for CRM:  *4TH Request* Patient medication (doxazosin (CARDURA) 8 MG tablet) was sent in to the incorrect pharmacy back on 2/13. The medication needs to be sent to [facility]. Original order was sent to the [facility]. Patient is now completely out of blood pressure medication.  PLEASE SEND MED TO [facility] SERVICE BELOW  [facility] - [place], [place] - [address]  [address]  [place] [address]  Phone: [phone] Fax: [phone]

## 2023-10-19 NOTE — Telephone Encounter (Signed)
 Refills have been sent. KC sent refills to local pharm a couple of days ago.

## 2023-10-21 ENCOUNTER — Other Ambulatory Visit (INDEPENDENT_AMBULATORY_CARE_PROVIDER_SITE_OTHER): Payer: Medicare Other

## 2023-10-21 DIAGNOSIS — D649 Anemia, unspecified: Secondary | ICD-10-CM | POA: Diagnosis not present

## 2023-10-25 LAB — FECAL OCCULT BLOOD, IMMUNOCHEMICAL: Fecal Occult Bld: NEGATIVE

## 2023-10-26 ENCOUNTER — Encounter: Payer: Self-pay | Admitting: Family Medicine

## 2024-01-04 ENCOUNTER — Ambulatory Visit (INDEPENDENT_AMBULATORY_CARE_PROVIDER_SITE_OTHER)

## 2024-01-04 VITALS — BP 120/64 | Ht 76.0 in | Wt 235.0 lb

## 2024-01-04 DIAGNOSIS — Z0000 Encounter for general adult medical examination without abnormal findings: Secondary | ICD-10-CM | POA: Diagnosis not present

## 2024-01-04 NOTE — Progress Notes (Signed)
 Because this visit was a virtual/telehealth visit,  certain criteria was not obtained, such a blood pressure, CBG if applicable, and timed get up and go. Any medications not marked as "taking" were not mentioned during the medication reconciliation part of the visit. Any vitals not documented were not able to be obtained due to this being a telehealth visit or patient was unable to self-report a recent blood pressure reading due to a lack of equipment at home via telehealth. Vitals that have been documented are verbally provided by the patient.   This visit was performed by a medical professional under my direct supervision. I was immediately available for consultation/collaboration. I have reviewed and agree with the Annual Wellness Visit documentation.  Subjective:   Collin Rose is a 89 y.o. who presents for a Medicare Wellness preventive visit.  As a reminder, Annual Wellness Visits don't include a physical exam, and some assessments may be limited, especially if this visit is performed virtually. We may recommend an in-person visit if needed.  Visit Complete: Virtual I connected with  Collin Rose on 01/04/24 by a audio enabled telemedicine application and verified that I am speaking with the correct person using two identifiers.  Patient Location: Home  Provider Location: Home Office  I discussed the limitations of evaluation and management by telemedicine. The patient expressed understanding and agreed to proceed.  Vital Signs: Because this visit was a virtual/telehealth visit, some criteria may be missing or patient reported. Any vitals not documented were not able to be obtained and vitals that have been documented are patient reported.  VideoDeclined- This patient declined Librarian, academic. Therefore the visit was completed with audio only.  Persons Participating in Visit: Patient.  AWV Questionnaire: No: Patient Medicare AWV questionnaire was not  completed prior to this visit.  Cardiac Risk Factors include: advanced age (>62men, >74 women);hypertension;male gender     Objective:     Today's Vitals   01/04/24 0944  BP: 120/64  Weight: 235 lb (106.6 kg)  Height: 6\' 4"  (1.93 m)   Body mass index is 28.61 kg/m.     01/04/2024    9:49 AM 12/24/2022   11:25 AM 12/22/2021    8:39 AM 12/22/2021    8:36 AM 12/18/2020    2:38 PM 12/14/2019    1:08 PM 08/14/2016    9:58 AM  Advanced Directives  Does Patient Have a Medical Advance Directive? Yes Yes  Yes No No No  Type of Estate agent of [place];Living will Healthcare Power of [place];Living will Healthcare Power of [place];Living will;Out of facility DNR (pink MOST or yellow form) Healthcare Power of [place];Living will;Out of facility DNR (pink MOST or yellow form)     Does patient want to make changes to medical advance directive? No - Patient declined        Copy of Healthcare Power of Attorney in Chart? Yes - validated most recent copy scanned in chart (See row information) Yes - validated most recent copy scanned in chart (See row information) Yes - validated most recent copy scanned in chart (See row information)      Would patient like information on creating a medical advance directive?     No - Patient declined No - Patient declined     Current Medications (verified) Outpatient Encounter Medications as of 01/04/2024  Medication Sig   b complex vitamins capsule Take 1 capsule by mouth daily.   doxazosin  (CARDURA ) 8 MG tablet Take 1 tablet (8  mg total) by mouth daily.   hydrochlorothiazide  (MICROZIDE ) 12.5 MG capsule TAKE 1 CAPSULE(12.5 MG) BY MOUTH DAILY   lisinopril  (ZESTRIL ) 10 MG tablet Take 1 tablet (10 mg total) by mouth daily.   No facility-administered encounter medications on file as of 01/04/2024.    Allergies (verified) Patient has no known allergies.   History: Past Medical History:  Diagnosis Date   Colon polyps    Heart murmur     Hypertension    Past Surgical History:  Procedure Laterality Date   BACK SURGERY     SPINE SURGERY     Family History  Problem Relation Age of Onset   Cancer Mother    Stroke Father    Social History   Socioeconomic History   Marital status: Married    Spouse name: Not on file   Number of children: Not on file   Years of education: Not on file   Highest education level: Not on file  Occupational History   Occupation: retired  Tobacco Use   Smoking status: Former    Current packs/day: 1.00    Types: Cigarettes   Smokeless tobacco: Never  Substance and Sexual Activity   Alcohol use: Yes   Drug use: Not on file   Sexual activity: Not on file  Other Topics Concern   Not on file  Social History Narrative   Not on file   Social Drivers of Health   Financial Resource Strain: Low Risk  (01/04/2024)   Overall Financial Resource Strain (CARDIA)    Difficulty of Paying Living Expenses: Not hard at all  Food Insecurity: No Food Insecurity (01/04/2024)   Hunger Vital Sign    Worried About Running Out of Food in the Last Year: Never true    Ran Out of Food in the Last Year: Never true  Transportation Needs: No Transportation Needs (01/04/2024)   PRAPARE - Administrator, Civil Service (Medical): No    Lack of Transportation (Non-Medical): No  Physical Activity: Sufficiently Active (01/04/2024)   Exercise Vital Sign    Days of Exercise per Week: 7 days    Minutes of Exercise per Session: 30 min  Stress: No Stress Concern Present (01/04/2024)   [employer] of Occupational Health - Occupational Stress Questionnaire    Feeling of Stress : Not at all  Social Connections: Moderately Isolated (01/04/2024)   Social Connection and Isolation Panel [NHANES]    Frequency of Communication with Friends and Family: Once a week    Frequency of Social Gatherings with Friends and Family: More than three times a week    Attends Religious Services: Never    Doctor, general practice or Organizations: No    Attends Engineer, structural: Never    Marital Status: Married    Tobacco Counseling Counseling given: Not Answered    Clinical Intake:  Pre-visit preparation completed: Yes  Pain : No/denies pain     BMI - recorded: 28.61 Nutritional Status: BMI 25 -29 Overweight Nutritional Risks: None Diabetes: No  Lab Results  Component Value Date   HGBA1C 5.8 09/14/2023   HGBA1C 5.6 08/18/2022   HGBA1C 5.4 03/10/2022     How often do you need to have someone help you when you read instructions, pamphlets, or other written materials from your doctor or pharmacy?: 1 - Never What is the last grade level you completed in school?: HS graduate  Interpreter Needed?: No  Information entered by :: Campbell Kray [employer]   Activities of  Daily Living     01/04/2024    9:48 AM  In your present state of health, do you have any difficulty performing the following activities:  Hearing? 0  Vision? 0  Difficulty concentrating or making decisions? 0  Walking or climbing stairs? 0  Dressing or bathing? 0  Doing errands, shopping? 0  Preparing Food and eating ? N  Using the Toilet? N  In the past six months, have you accidently leaked urine? N  Do you have problems with loss of bowel control? N  Managing your Medications? N  Managing your Finances? N  Housekeeping or managing your Housekeeping? N    Patient Care Team: Copland, Skipper Dumas, MD as PCP - General (Family Medicine) Glory Larsen, MD as Consulting Physician (Dermatology) Suella Emmer, MD as Consulting Physician (Optometry)  Indicate any recent Medical Services you may have received from other than [facility] providers in the past year (date may be approximate).     Assessment:    This is a routine wellness examination for [facility].  Hearing/Vision screen Hearing Screening - Comments:: No hear difficulties Vision Screening - Comments:: No vision difficulties   Goals Addressed              This Visit's Progress    Increase physical activity   On track      Depression Screen     01/04/2024    9:49 AM 09/14/2023    9:34 AM 01/24/2023    8:57 AM 12/24/2022   11:02 AM 08/18/2022    8:55 AM 12/22/2021    8:37 AM 08/12/2021    8:45 AM  PHQ 2/9 Scores  PHQ - 2 Score 0 0 0 0 0 0 0  PHQ- 9 Score 0          Fall Risk     01/04/2024    9:48 AM 09/14/2023    9:34 AM 01/24/2023    8:57 AM 12/24/2022   11:01 AM 08/18/2022    8:55 AM  Fall Risk   Falls in the past year? 0 0 0 0 0  Number falls in past yr: 0 0 0 0 0  Injury with Fall? 0 0 0 0 0  Risk for fall due to : No Fall Risks No Fall Risks No Fall Risks No Fall Risks No Fall Risks  Follow up Falls prevention discussed;Falls evaluation completed Falls evaluation completed Falls evaluation completed Falls evaluation completed Falls evaluation completed    MEDICARE RISK AT HOME:  Medicare Risk at Home Any stairs in or around the home?: Yes If so, are there any without handrails?: No Home free of loose throw rugs in walkways, pet beds, electrical cords, etc?: Yes Adequate lighting in your home to reduce risk of falls?: Yes Life alert?: No Use of a cane, walker or w/c?: No Grab bars in the bathroom?: Yes Shower chair or bench in shower?: Yes Elevated toilet seat or a handicapped toilet?: Yes  TIMED UP AND GO:  Was the test performed?  No  Cognitive Function: 6CIT completed        01/04/2024    9:46 AM 12/24/2022   11:06 AM 12/22/2021    8:40 AM  6CIT Screen  What Year? 0 points 0 points 0 points  What month? 0 points 0 points 0 points  What time? 0 points 0 points 0 points  Count back from 20 0 points 0 points 0 points  Months in reverse 0 points 0 points 0 points  Repeat phrase 0  points 0 points 6 points  Total Score 0 points 0 points 6 points    Immunizations Immunization History  Administered Date(s) Administered   Fluad Quad(high Dose 65+) 05/15/2019, 06/02/2020, 05/13/2021, 06/10/2022   Fluad  Trivalent(High Dose 65+) 05/30/2023   Influenza Split 05/26/2012   Influenza, High Dose Seasonal PF 05/27/2017, 05/24/2018   Influenza,inj,Quad PF,6+ Mos 05/23/2013, 05/23/2014, 05/26/2015, 05/26/2016   Janssen (J&J) SARS-COV-2 Vaccination 11/26/2019   PFIZER(Purple Top)SARS-COV-2 Vaccination 07/20/2020   Pfizer Covid-19 Vaccine Bivalent Booster 41yrs & up 08/10/2021   Pfizer(Comirnaty )Fall Seasonal Vaccine 12 years and older 06/10/2022, 05/30/2023   Pneumococcal Conjugate-13 02/14/2014   Pneumococcal Polysaccharide-23 08/12/2021   Pneumococcal-Unspecified 10/22/2007   Respiratory Syncytial Virus Vaccine ,Recomb Aduvanted(Arexvy ) 09/14/2023   Td 10/22/2007   Tdap 08/06/2018   Zoster Recombinant(Shingrix) 06/13/2018, 08/12/2018   Zoster, Live 08/23/2013    Screening Tests Health Maintenance  Topic Date Due   COVID-19 Vaccine (6 - 2024-25 season) 11/28/2023   INFLUENZA VACCINE  03/23/2024   Medicare Annual Wellness (AWV)  01/03/2025   DTaP/Tdap/Td (3 - Td or Tdap) 08/06/2028   Pneumonia Vaccine 55+ Years old  Completed   Zoster Vaccines- Shingrix  Completed   HPV VACCINES  Aged Out   Meningococcal B Vaccine  Aged Out   Colonoscopy  Discontinued    Health Maintenance  Health Maintenance Due  Topic Date Due   COVID-19 Vaccine (6 - 2024-25 season) 11/28/2023   Health Maintenance Items Addressed:declined covid vaccine  Additional Screening:  Vision Screening: Recommended annual ophthalmology exams for early detection of glaucoma and other disorders of the eye.  Dental Screening: Recommended annual dental exams for proper oral hygiene  Community Resource Referral / Chronic Care Management: CRR required this visit?  No   CCM required this visit?  No   Plan:    I have personally reviewed and noted the following in the patient's chart:   Medical and social history Use of alcohol, tobacco or illicit drugs  Current medications and supplements including opioid  prescriptions. Patient is not currently taking opioid prescriptions. Functional ability and status Nutritional status Physical activity Advanced directives List of other physicians Hospitalizations, surgeries, and ER visits in previous 12 months Vitals Screenings to include cognitive, depression, and falls Referrals and appointments  In addition, I have reviewed and discussed with patient certain preventive protocols, quality metrics, and best practice recommendations. A written personalized care plan for preventive services as well as general preventive health recommendations were provided to patient.   Freeda Jerry, [place]   01/04/2024   After Visit Summary: (MyChart) Due to this being a telephonic visit, the after visit summary with patients personalized plan was offered to patient via MyChart   Notes: Nothing significant to report at this time.

## 2024-01-04 NOTE — Patient Instructions (Signed)
 Mr. Goethals , Thank you for taking time out of your busy schedule to complete your Annual Wellness Visit with me. I enjoyed our conversation and look forward to speaking with you again next year. I, as well as your care team,  appreciate your ongoing commitment to your health goals. Please review the following plan we discussed and let me know if I can assist you in the future. Your Game plan/ To Do List    Referrals: If you haven't heard from the office you've been referred to, please reach out to them at the phone provided.   Follow up Visits: Next Medicare AWV with our clinical staff: 01/09/2025   Have you seen your provider in the last 6 months (3 months if uncontrolled diabetes)? Yes Next Office Visit with your provider: n/a  Clinician Recommendations:  Aim for 30 minutes of exercise or brisk walking, 6-8 glasses of water, and 5 servings of fruits and vegetables each day.       This is a list of the screening recommended for you and due dates:  Health Maintenance  Topic Date Due   COVID-19 Vaccine (6 - 2024-25 season) 11/28/2023   Flu Shot  03/23/2024   Medicare Annual Wellness Visit  01/03/2025   DTaP/Tdap/Td vaccine (3 - Td or Tdap) 08/06/2028   Pneumonia Vaccine  Completed   Zoster (Shingles) Vaccine  Completed   HPV Vaccine  Aged Out   Meningitis B Vaccine  Aged Out   Colon Cancer Screening  Discontinued    Advanced directives: (In Chart) A copy of your advanced directives are scanned into your chart should your provider ever need it. Advance Care Planning is important because it:  [x]  Makes sure you receive the medical care that is consistent with your values, goals, and preferences  [x]  It provides guidance to your family and loved ones and reduces their decisional burden about whether or not they are making the right decisions based on your wishes.  Follow the link provided in your after visit summary or read over the paperwork we have mailed to you to help you started  getting your Advance Directives in place. If you need assistance in completing these, please reach out to us  so that we can help you!  See attachments for Preventive Care and Fall Prevention Tips.

## 2024-02-22 ENCOUNTER — Other Ambulatory Visit: Payer: Self-pay | Admitting: Family Medicine

## 2024-02-22 DIAGNOSIS — R600 Localized edema: Secondary | ICD-10-CM

## 2024-02-22 DIAGNOSIS — I10 Essential (primary) hypertension: Secondary | ICD-10-CM

## 2024-02-22 MED ORDER — HYDROCHLOROTHIAZIDE 12.5 MG PO CAPS: 12.5000 mg | ORAL_CAPSULE | Freq: Every day | ORAL | 0 refills | Status: DC

## 2024-02-22 NOTE — Telephone Encounter (Signed)
 Copied from CRM [id number]. Topic: Clinical - Medication Refill >> Feb 22, 2024  3:48 PM Suzen RAMAN wrote: Medication: hydrochlorothiazide  (MICROZIDE ) 12.5 MG capsule 90 days supply with 3 refill  Has the patient contacted their pharmacy? Yes  This is the patient's preferred pharmacy:   [facility] - [place], [place] - [address] [place] [address] Phone: [phone] Fax: [phone]  Is this the correct pharmacy for this prescription? Yes If no, delete pharmacy and type the correct one.   Has the prescription been filled recently? No  Is the patient out of the medication? No  Has the patient been seen for an appointment in the last year OR does the patient have an upcoming appointment? Yes  Can we respond through MyChart? No  Agent: Please be advised that Rx refills may take up to 3 business days. We ask that you follow-up with your pharmacy.

## 2024-02-26 NOTE — Patient Instructions (Incomplete)
 It was great to see you again today I will be in touch with your labs, assuming all is well please see me in about 6 months  You and Collin Rose could both use a covid booster shot at our pharmacy downstairs

## 2024-02-26 NOTE — Progress Notes (Unsigned)
  Healthcare at [facility] [address], [address] [place], [place] [address] [phone]  Date:  02/29/2024   Name:  Collin Rose   DOB:  1935/07/11   MRN:  [id number]  PCP:  Watt Harlene BROCKS, MD    Chief Complaint: No chief complaint on file.   History of Present Illness:  [name] is a 89 y.o. very pleasant male patient who presents with the following:  Patient seen today for periodic follow-up.  Most recent visit with myself was in January  History of prediabetes, bradycardia, hypertension, history of polio with resultant muscle weakness and neuropathy Married to Collin Rose, they live independently-they got married a little bit later in life, Lorence notes they have been married about 35 years  Taking hydrochlorothiazide  and lisinopril  for blood pressure We updated some blood work in January and he also had some labs in [place], CBC, A1c We have noticed some minimal renal insufficiency and also mild anemia stable over the last year or so. Kriston completed an occult blood test which was negative at the end of Patient Active Problem List   Diagnosis Date Noted   Pre-diabetes 09/01/2016   Bradycardia 04/09/2015   Post-polio muscle weakness 02/14/2014   Hypertension 12/13/2011   GERD (gastroesophageal reflux disease) 12/13/2011   Neuropathy 12/13/2011    Past Medical History:  Diagnosis Date   Colon polyps    Heart murmur    Hypertension     Past Surgical History:  Procedure Laterality Date   BACK SURGERY     SPINE SURGERY      Social History   Tobacco Use   Smoking status: Former    Current packs/day: 1.00    Types: Cigarettes   Smokeless tobacco: Never  Substance Use Topics   Alcohol use: Yes    Family History  Problem Relation Age of Onset   Cancer Mother    Stroke Father     No Known Allergies  Medication list has been reviewed and updated.  Current Outpatient Medications on File Prior to Visit   Medication Sig Dispense Refill   b complex vitamins capsule Take 1 capsule by mouth daily.     doxazosin  (CARDURA ) 8 MG tablet Take 1 tablet (8 mg total) by mouth daily. 90 tablet 1   hydrochlorothiazide  (MICROZIDE ) 12.5 MG capsule Take 1 capsule (12.5 mg total) by mouth daily. 90 capsule 0   lisinopril  (ZESTRIL ) 10 MG tablet Take 1 tablet (10 mg total) by mouth daily. 90 tablet 3   No current facility-administered medications on file prior to visit.    Review of Systems:  As per HPI- otherwise negative.   Physical Examination: There were no vitals filed for this visit. There were no vitals filed for this visit. There is no height or weight on file to calculate BMI. Ideal Body Weight:    GEN: no acute distress. HEENT: Atraumatic, Normocephalic.  Ears and Nose: No external deformity. CV: RRR, No M/G/R. No JVD. No thrill. No extra heart sounds. PULM: CTA B, no wheezes, crackles, rhonchi. No retractions. No resp. distress. No accessory muscle use. ABD: S, NT, ND, +BS. No rebound. No HSM. EXTR: No c/c/e PSYCH: Normally interactive. Conversant.    Assessment and Plan: ***  Signed Harlene Watt, MD

## 2024-02-29 ENCOUNTER — Ambulatory Visit (INDEPENDENT_AMBULATORY_CARE_PROVIDER_SITE_OTHER): Admitting: Family Medicine

## 2024-02-29 ENCOUNTER — Other Ambulatory Visit (HOSPITAL_BASED_OUTPATIENT_CLINIC_OR_DEPARTMENT_OTHER): Payer: Self-pay

## 2024-02-29 VITALS — BP 130/75 | HR 58 | Temp 98.3°F | Ht 76.0 in | Wt 237.0 lb

## 2024-02-29 DIAGNOSIS — M6281 Muscle weakness (generalized): Secondary | ICD-10-CM

## 2024-02-29 DIAGNOSIS — E785 Hyperlipidemia, unspecified: Secondary | ICD-10-CM | POA: Diagnosis not present

## 2024-02-29 DIAGNOSIS — R7303 Prediabetes: Secondary | ICD-10-CM | POA: Diagnosis not present

## 2024-02-29 DIAGNOSIS — D649 Anemia, unspecified: Secondary | ICD-10-CM

## 2024-02-29 DIAGNOSIS — I10 Essential (primary) hypertension: Secondary | ICD-10-CM

## 2024-02-29 DIAGNOSIS — B91 Sequelae of poliomyelitis: Secondary | ICD-10-CM

## 2024-02-29 DIAGNOSIS — R600 Localized edema: Secondary | ICD-10-CM

## 2024-02-29 LAB — COMPREHENSIVE METABOLIC PANEL WITH GFR
ALT: 11 U/L (ref 0–53)
AST: 15 U/L (ref 0–37)
Albumin: 4 g/dL (ref 3.5–5.2)
Alkaline Phosphatase: 105 U/L (ref 39–117)
BUN: 18 mg/dL (ref 6–23)
CO2: 31 meq/L (ref 19–32)
Calcium: 9 mg/dL (ref 8.4–10.5)
Chloride: 104 meq/L (ref 96–112)
Creatinine, Ser: 1.05 mg/dL (ref 0.40–1.50)
GFR: 64.18 mL/min (ref >60.00)
Glucose, Bld: 94 mg/dL (ref 70–99)
Potassium: 4.9 meq/L (ref 3.5–5.1)
Sodium: 139 meq/L (ref 135–145)
Total Bilirubin: 0.8 mg/dL (ref 0.2–1.2)
Total Protein: 6.3 g/dL (ref 6.0–8.3)

## 2024-02-29 LAB — CBC
HCT: 37.6 % — ABNORMAL LOW (ref 39.0–52.0)
Hemoglobin: 12.7 g/dL — ABNORMAL LOW (ref 13.0–17.0)
MCHC: 33.8 g/dL (ref 30.0–36.0)
MCV: 98.1 fl (ref 78.0–100.0)
Platelets: 147 K/uL — ABNORMAL LOW (ref 150.0–400.0)
RBC: 3.83 Mil/uL — ABNORMAL LOW (ref 4.22–5.81)
RDW: 13 % (ref 11.5–15.5)
WBC: 5.2 K/uL (ref 4.0–10.5)

## 2024-02-29 LAB — LIPID PANEL
Cholesterol: 133 mg/dL (ref 0–200)
HDL: 46.5 mg/dL (ref >39.00)
LDL Cholesterol: 74 mg/dL (ref 0–99)
NonHDL: 86.79
Total CHOL/HDL Ratio: 3
Triglycerides: 62 mg/dL (ref 0.0–149.0)
VLDL: 12.4 mg/dL (ref 0.0–40.0)

## 2024-02-29 LAB — FERRITIN: Ferritin: 147.6 ng/mL (ref 22.0–322.0)

## 2024-02-29 LAB — HEMOGLOBIN A1C: Hgb A1c MFr Bld: 5.6 % (ref 4.6–6.5)

## 2024-02-29 MED ORDER — COVID-19 MRNA VAC-TRIS(PFIZER) 30 MCG/0.3ML IM SUSY
0.3000 mL | PREFILLED_SYRINGE | Freq: Once | INTRAMUSCULAR | 0 refills | Status: AC
  Filled 2024-02-29: qty 0.3, 1d supply, fill #0

## 2024-02-29 MED ORDER — HYDROCHLOROTHIAZIDE 12.5 MG PO CAPS: 12.5000 mg | ORAL_CAPSULE | Freq: Every day | ORAL | 3 refills | Status: AC

## 2024-02-29 MED ORDER — DOXAZOSIN MESYLATE 8 MG PO TABS: 8.0000 mg | ORAL_TABLET | Freq: Every day | ORAL | 3 refills | Status: AC

## 2024-03-08 DIAGNOSIS — L570 Actinic keratosis: Secondary | ICD-10-CM | POA: Diagnosis not present

## 2024-03-08 DIAGNOSIS — L280 Lichen simplex chronicus: Secondary | ICD-10-CM | POA: Diagnosis not present

## 2024-03-08 DIAGNOSIS — L814 Other melanin hyperpigmentation: Secondary | ICD-10-CM | POA: Diagnosis not present

## 2024-03-08 DIAGNOSIS — L821 Other seborrheic keratosis: Secondary | ICD-10-CM | POA: Diagnosis not present

## 2024-03-19 DIAGNOSIS — H26491 Other secondary cataract, right eye: Secondary | ICD-10-CM | POA: Diagnosis not present

## 2024-03-19 DIAGNOSIS — H353131 Nonexudative age-related macular degeneration, bilateral, early dry stage: Secondary | ICD-10-CM | POA: Diagnosis not present

## 2024-03-19 DIAGNOSIS — H40013 Open angle with borderline findings, low risk, bilateral: Secondary | ICD-10-CM | POA: Diagnosis not present

## 2024-03-19 DIAGNOSIS — H35373 Puckering of macula, bilateral: Secondary | ICD-10-CM | POA: Diagnosis not present

## 2024-03-19 DIAGNOSIS — Z961 Presence of intraocular lens: Secondary | ICD-10-CM | POA: Diagnosis not present

## 2024-05-25 ENCOUNTER — Ambulatory Visit (INDEPENDENT_AMBULATORY_CARE_PROVIDER_SITE_OTHER)

## 2024-05-25 DIAGNOSIS — Z23 Encounter for immunization: Secondary | ICD-10-CM | POA: Diagnosis not present

## 2024-09-10 ENCOUNTER — Telehealth: Payer: Self-pay

## 2024-09-10 DIAGNOSIS — I10 Essential (primary) hypertension: Secondary | ICD-10-CM

## 2024-09-10 MED ORDER — LISINOPRIL 10 MG PO TABS: 10.0000 mg | ORAL_TABLET | Freq: Every day | ORAL | 3 refills | Status: DC

## 2024-09-10 NOTE — Addendum Note (Signed)
 Addended by: ORVIN HARLENE HERO on: 09/10/2024 11:20 AM   Modules accepted: Orders

## 2024-09-10 NOTE — Telephone Encounter (Signed)
 Copied from CRM #[id number]. Topic: Clinical - Medication Refill >> Sep 10, 2024  9:31 AM Mesmerise C wrote: Medication: lisinopril  (ZESTRIL ) 10 MG tablet  Has the patient contacted their pharmacy? No (Agent: If no, request that the patient contact the pharmacy for the refill. If patient does not wish to contact the pharmacy document the reason why and proceed with request.) (Agent: If yes, when and what did the pharmacy advise?)  This is the patient's preferred pharmacy:  [facility] - [place], [place] - [address] [place] [address] Phone: [phone] Fax: [phone]  Is this the correct pharmacy for this prescription? Yes If no, delete pharmacy and type the correct one.   Has the prescription been filled recently? No  Is the patient out of the medication? No  Has the patient been seen for an appointment in the last year OR does the patient have an upcoming appointment? Yes  Can we respond through MyChart? No  Agent: Please be advised that Rx refills may take up to 3 business days. We ask that you follow-up with your pharmacy.

## 2024-09-10 NOTE — Telephone Encounter (Signed)
 Rx has been sent to pharmacy

## 2024-09-12 MED ORDER — LISINOPRIL 10 MG PO TABS: 10.0000 mg | ORAL_TABLET | Freq: Every day | ORAL | 3 refills | Status: AC

## 2024-09-12 NOTE — Telephone Encounter (Unsigned)
 Copied from CRM #[id number]. Topic: Clinical - Prescription Issue >> Sep 12, 2024  8:50 AM Rea ORN wrote: Reason for CRM: Pt stated that he spoke to [facility] order today and they said they did not receive the lisinopril  rx that was sent on 09/10/24. Pt is asking for this rx to be sent again.    Please call back to advise,  [phone]

## 2024-09-12 NOTE — Telephone Encounter (Signed)
 Rx resent.

## 2024-09-17 ENCOUNTER — Ambulatory Visit: Admitting: Family Medicine

## 2024-09-18 NOTE — Progress Notes (Addendum)
 [facility], [facility] at [facility] [address] [place], [place] [address] [phone]  Date:  09/19/2024   Name:  Collin [place]   DOB:  03-22-1935   MRN:  [id number]  PCP:  Collin Harlene BROCKS, MD    Chief Complaint: Follow-up   History of Present Illness:  Collin Rose is a 89 y.o. very pleasant male patient who presents with the following:  Patient seen today for periodic follow-up.  I saw him most recently in July History of prediabetes, bradycardia, hypertension, history of polio with resultant muscle weakness and neuropathy Married to [name], they live independently-they got married a little bit later in life, Dixon notes they have been married about 35 years   Taking hydrochlorothiazide  and lisinopril  for blood pressure He is seen by dermatology for sun-damaged skin Most recent COVID booster was in July 2025 Flu shot is up-to-date He has completed Shingrix and RSV Blood work completed in July-minimal anemia noted-this has been stable  Discussed the use of AI scribe software for clinical note transcription with the patient, who gave verbal consent to proceed.  History of Present Illness Collin Rose is an 89 year old male who presents with lower back pain and concerns about measles immunity.  He has been experiencing lower back pain for the past two to three days, localized to the right sided lower back and tender to pressure. He denies any recent falls or injuries but suspects it may be due to lifting something heavy. The pain does not worsen with bending forward, but he notes morning stiffness. He has been taking ibuprofen and Tylenol  intermittently for pain relief, but not daily, and finds the pain manageable without stronger medication. No chest pain or shortness of breath.  He is concerned about his immunity to measles, as he does not recall having the disease in his youth. He does not recall having measles as a child and is unsure  of his immunity. He takes his blood pressure medication every morning as prescribed.    Patient Active Problem List   Diagnosis Date Noted   Pre-diabetes 09/01/2016   Bradycardia 04/09/2015   Post-polio muscle weakness 02/14/2014   Hypertension 12/13/2011   GERD (gastroesophageal reflux disease) 12/13/2011   Neuropathy 12/13/2011    Past Medical History:  Diagnosis Date   Colon polyps    Heart murmur    Hypertension     Past Surgical History:  Procedure Laterality Date   BACK SURGERY     SPINE SURGERY      Social History[1]  Family History  Problem Relation Age of Onset   Cancer Mother    Stroke Father     Allergies[2]  Medication list has been reviewed and updated.  Medications Ordered Prior to Encounter[3]  Review of Systems:  As per HPI- otherwise negative.  BP Readings from Last 3 Encounters:  09/19/24 (!) 142/64  02/29/24 130/75  01/04/24 120/64    Physical Examination: Vitals:   09/19/24 0837  BP: (!) 142/64  Pulse: 63  SpO2: 99%   Vitals:   09/19/24 0837  Weight: 238 lb 6.4 oz (108.1 kg)  Height: 6' 4 (1.93 m)   Body mass index is 29.02 kg/m. Ideal Body Weight: Weight in (lb) to have BMI = 25: 205  GEN: no acute distress.  Tall build, overweight, looks well  HEENT: Atraumatic, Normocephalic.  Ears and Nose: No external deformity. CV: irregular pulse, No M/G/R. No JVD. No  thrill. No extra heart sounds. PULM: CTA B, no wheezes, crackles, rhonchi. No retractions. No resp. distress. No accessory muscle use. EXTR: No c/c/e PSYCH: Normally interactive. Conversant.  I am able to reproduce tenderness by pressing on the musculature in the right paraspinous lumbar/upper buttock area.  There are a couple of small red marks on the skin which I asked patient about-he notes he has been using a heating pad and thinks he may have burned himself just slightly.  Otherwise it does not appear consistent with shingles outbreak  Irregular pulse today so  we obtained an EKG EKG: SR with frequent pvc Assessment and Plan: Essential hypertension - Plan: Basic metabolic panel with GFR, CBC  Post-polio muscle weakness  Mild anemia - Plan: CBC  Pre-diabetes  Hyperlipidemia, unspecified hyperlipidemia type  At increased risk for exposure to measles virus - Plan: Measles/Mumps/Rubella Immunity  Irregularly irregular pulse rhythm - Plan: EKG 12-Lead  Assessment & Plan Adult Wellness Visit Routine wellness visit with no new complaints. Blood pressure slightly elevated at 142 mmHg. Heart rate irregular with pulse variability. - Performed EKG to assess heart rhythm. - Obtained blood work for routine checkup. Continue current blood pressure medication for now  Low back pain Chronic low back pain. Managed with ibuprofen and Tylenol  as needed. No desire for stronger medication. - Continue current pain management with ibuprofen and Tylenol  as needed.  Essential hypertension Blood pressure slightly elevated at 142 mmHg. Continues on current antihypertensive regimen. - Continue current antihypertensive medications. EKG confirms sinus rhythm  At increased risk for exposure to measles virus Uncertain measles immunity due to age and lack of vaccination history. Discussed possibility of past mild measles infection. Offered measles immunity titer to confirm immunity status. - Ordered measles immunity titer.  Recheck 6 months assuming all is well  Signed Harlene Schroeder, MD  Received labs- he does not have mychart, await MMR titer for letter Mild anemia, minimal thrombocytopenia and increase in MCV is relatively new.  He did do a negative FOBT in February 2025 Colonoscopy 2018-1 polyp removed B12 has been normal when it was checked previously Will have him repeat FOBT this year as well Addendum 1/30, received the rest of his labs.  Letter to patient  Results for orders placed or performed in visit on 09/19/24  Basic metabolic panel with GFR    Collection Time: 09/19/24  9:35 AM  Result Value Ref Range   Sodium 138 135 - 145 mEq/L   Potassium 5.1 3.5 - 5.1 mEq/L   Chloride 105 96 - 112 mEq/L   CO2 28 19 - 32 mEq/L   Glucose, Bld 103 (H) 70 - 99 mg/dL   BUN 19 6 - 23 mg/dL   Creatinine, Ser 9.03 0.40 - 1.50 mg/dL   GFR 28.80 >39.99 mL/min   Calcium 8.8 8.4 - 10.5 mg/dL  CBC   Collection Time: 09/19/24  9:35 AM  Result Value Ref Range   WBC 4.2 4.0 - 10.5 K/uL   RBC 3.67 (L) 4.22 - 5.81 Mil/uL   Platelets 147.0 (L) 150.0 - 400.0 K/uL   Hemoglobin 12.4 (L) 13.0 - 17.0 g/dL   HCT 62.7 (L) 60.9 - 47.9 %   MCV 101.3 (H) 78.0 - 100.0 fl   MCHC 33.2 30.0 - 36.0 g/dL   RDW 86.0 88.4 - 84.4 %  Measles/Mumps/Rubella Immunity   Collection Time: 09/19/24  9:35 AM  Result Value Ref Range   Rubeola IgG >300.00 AU/mL   Mumps IgG 54.40 AU/mL  Rubella 3.91 Index        [1]  Social History Tobacco Use   Smoking status: Former    Current packs/day: 1.00    Types: Cigarettes   Smokeless tobacco: Never  Substance Use Topics   Alcohol use: Yes  [2] No Known Allergies [3]  Current Outpatient Medications on File Prior to Visit  Medication Sig Dispense Refill   b complex vitamins capsule Take 1 capsule by mouth daily.     doxazosin  (CARDURA ) 8 MG tablet Take 1 tablet (8 mg total) by mouth daily. 90 tablet 3   hydrochlorothiazide  (MICROZIDE ) 12.5 MG capsule Take 1 capsule (12.5 mg total) by mouth daily. 90 capsule 3   lisinopril  (ZESTRIL ) 10 MG tablet Take 1 tablet (10 mg total) by mouth daily. 90 tablet 3   No current facility-administered medications on file prior to visit.   "

## 2024-09-19 ENCOUNTER — Encounter: Payer: Self-pay | Admitting: Family Medicine

## 2024-09-19 ENCOUNTER — Ambulatory Visit: Admitting: Family Medicine

## 2024-09-19 VITALS — BP 142/64 | HR 63 | Ht 76.0 in | Wt 238.4 lb

## 2024-09-19 DIAGNOSIS — D649 Anemia, unspecified: Secondary | ICD-10-CM | POA: Diagnosis not present

## 2024-09-19 DIAGNOSIS — M6281 Muscle weakness (generalized): Secondary | ICD-10-CM

## 2024-09-19 DIAGNOSIS — E785 Hyperlipidemia, unspecified: Secondary | ICD-10-CM

## 2024-09-19 DIAGNOSIS — I10 Essential (primary) hypertension: Secondary | ICD-10-CM | POA: Diagnosis not present

## 2024-09-19 DIAGNOSIS — R7303 Prediabetes: Secondary | ICD-10-CM

## 2024-09-19 DIAGNOSIS — B91 Sequelae of poliomyelitis: Secondary | ICD-10-CM | POA: Diagnosis not present

## 2024-09-19 DIAGNOSIS — I499 Cardiac arrhythmia, unspecified: Secondary | ICD-10-CM | POA: Diagnosis not present

## 2024-09-19 DIAGNOSIS — Z9189 Other specified personal risk factors, not elsewhere classified: Secondary | ICD-10-CM | POA: Diagnosis not present

## 2024-09-19 LAB — BASIC METABOLIC PANEL WITH GFR
BUN: 19 mg/dL (ref 6–23)
CO2: 28 meq/L (ref 19–32)
Calcium: 8.8 mg/dL (ref 8.4–10.5)
Chloride: 105 meq/L (ref 96–112)
Creatinine, Ser: 0.96 mg/dL (ref 0.40–1.50)
GFR: 71.19 mL/min
Glucose, Bld: 103 mg/dL — ABNORMAL HIGH (ref 70–99)
Potassium: 5.1 meq/L (ref 3.5–5.1)
Sodium: 138 meq/L (ref 135–145)

## 2024-09-19 LAB — CBC
HCT: 37.2 % — ABNORMAL LOW (ref 39.0–52.0)
Hemoglobin: 12.4 g/dL — ABNORMAL LOW (ref 13.0–17.0)
MCHC: 33.2 g/dL (ref 30.0–36.0)
MCV: 101.3 fl — ABNORMAL HIGH (ref 78.0–100.0)
Platelets: 147 10*3/uL — ABNORMAL LOW (ref 150.0–400.0)
RBC: 3.67 Mil/uL — ABNORMAL LOW (ref 4.22–5.81)
RDW: 13.9 % (ref 11.5–15.5)
WBC: 4.2 10*3/uL (ref 4.0–10.5)

## 2024-09-19 NOTE — Patient Instructions (Signed)
 It was great to see you today- I will be in touch with your labs asap Please see me in about 6 months assuming all is well  We will make sure you are immune to measles today!

## 2024-09-20 LAB — MEASLES/MUMPS/RUBELLA IMMUNITY
Mumps IgG: 54.4 [AU]/ml
Rubella: 3.91 {index}
Rubeola IgG: >300 [AU]/ml

## 2024-09-21 NOTE — Addendum Note (Signed)
 Addended by: WATT RAISIN C on: 09/21/2024 02:51 PM   Modules accepted: Orders

## 2025-01-09 ENCOUNTER — Ambulatory Visit
